# Patient Record
Sex: Male | Born: 1960 | Hispanic: No | Marital: Married | Smoking: Former smoker
Health system: Southern US, Community
[De-identification: ages and names within clinical notes are randomized; demographics above are authoritative.]

## PROBLEM LIST (undated history)

## (undated) DIAGNOSIS — K219 Gastro-esophageal reflux disease without esophagitis: Secondary | ICD-10-CM

## (undated) DIAGNOSIS — K5792 Diverticulitis of intestine, part unspecified, without perforation or abscess without bleeding: Secondary | ICD-10-CM

## (undated) DIAGNOSIS — T4145XA Adverse effect of unspecified anesthetic, initial encounter: Secondary | ICD-10-CM

## (undated) DIAGNOSIS — T8859XA Other complications of anesthesia, initial encounter: Secondary | ICD-10-CM

## (undated) DIAGNOSIS — E7800 Pure hypercholesterolemia, unspecified: Secondary | ICD-10-CM

## (undated) HISTORY — PX: TONSILLECTOMY: SUR1361

## (undated) HISTORY — PX: PILONIDAL CYST EXCISION: SHX744

---

## 2011-12-28 DIAGNOSIS — F524 Premature ejaculation: Secondary | ICD-10-CM | POA: Insufficient documentation

## 2013-01-15 ENCOUNTER — Other Ambulatory Visit: Payer: Self-pay | Admitting: Nurse Practitioner

## 2013-01-15 DIAGNOSIS — R0989 Other specified symptoms and signs involving the circulatory and respiratory systems: Secondary | ICD-10-CM

## 2013-01-21 ENCOUNTER — Ambulatory Visit
Admission: RE | Admit: 2013-01-21 | Discharge: 2013-01-21 | Disposition: A | Discharge status: Home / Self Care | Payer: BC Managed Care – PPO | Source: Ambulatory Visit | Attending: Nurse Practitioner | Admitting: Nurse Practitioner

## 2013-01-21 DIAGNOSIS — R0989 Other specified symptoms and signs involving the circulatory and respiratory systems: Secondary | ICD-10-CM

## 2013-04-04 DIAGNOSIS — Z720 Tobacco use: Secondary | ICD-10-CM | POA: Insufficient documentation

## 2013-04-04 DIAGNOSIS — E785 Hyperlipidemia, unspecified: Secondary | ICD-10-CM | POA: Insufficient documentation

## 2014-03-13 DIAGNOSIS — M6530 Trigger finger, unspecified finger: Secondary | ICD-10-CM | POA: Insufficient documentation

## 2014-11-17 DIAGNOSIS — R300 Dysuria: Secondary | ICD-10-CM | POA: Insufficient documentation

## 2014-11-17 DIAGNOSIS — N400 Enlarged prostate without lower urinary tract symptoms: Secondary | ICD-10-CM | POA: Insufficient documentation

## 2014-12-28 ENCOUNTER — Emergency Department (HOSPITAL_COMMUNITY): Payer: BLUE CROSS/BLUE SHIELD

## 2014-12-28 ENCOUNTER — Emergency Department (HOSPITAL_COMMUNITY)
Admission: EM | Admit: 2014-12-28 | Discharge: 2014-12-28 | Disposition: A | Discharge status: Home / Self Care | Payer: BLUE CROSS/BLUE SHIELD | Attending: Emergency Medicine | Admitting: Emergency Medicine

## 2014-12-28 ENCOUNTER — Encounter (HOSPITAL_COMMUNITY): Payer: Self-pay | Admitting: Nurse Practitioner

## 2014-12-28 DIAGNOSIS — R000 Tachycardia, unspecified: Secondary | ICD-10-CM | POA: Insufficient documentation

## 2014-12-28 DIAGNOSIS — M549 Dorsalgia, unspecified: Secondary | ICD-10-CM | POA: Diagnosis not present

## 2014-12-28 DIAGNOSIS — R509 Fever, unspecified: Secondary | ICD-10-CM | POA: Insufficient documentation

## 2014-12-28 DIAGNOSIS — R300 Dysuria: Secondary | ICD-10-CM | POA: Insufficient documentation

## 2014-12-28 DIAGNOSIS — Z720 Tobacco use: Secondary | ICD-10-CM | POA: Insufficient documentation

## 2014-12-28 DIAGNOSIS — M25562 Pain in left knee: Secondary | ICD-10-CM | POA: Insufficient documentation

## 2014-12-28 DIAGNOSIS — R110 Nausea: Secondary | ICD-10-CM | POA: Diagnosis not present

## 2014-12-28 DIAGNOSIS — R6889 Other general symptoms and signs: Secondary | ICD-10-CM

## 2014-12-28 DIAGNOSIS — R05 Cough: Secondary | ICD-10-CM | POA: Diagnosis not present

## 2014-12-28 LAB — COMPREHENSIVE METABOLIC PANEL
ALT: 39 U/L (ref 17–63)
AST: 41 U/L (ref 15–41)
Albumin: 3.7 g/dL (ref 3.5–5.0)
Alkaline Phosphatase: 102 U/L (ref 38–126)
Anion gap: 12 (ref 5–15)
BUN: 17 mg/dL (ref 6–20)
CHLORIDE: 103 mmol/L (ref 101–111)
CO2: 21 mmol/L — AB (ref 22–32)
Calcium: 9 mg/dL (ref 8.9–10.3)
Creatinine, Ser: 1.39 mg/dL — ABNORMAL HIGH (ref 0.61–1.24)
GFR, EST NON AFRICAN AMERICAN: 57 mL/min — AB (ref >60)
Glucose, Bld: 124 mg/dL — ABNORMAL HIGH (ref 65–99)
POTASSIUM: 4.5 mmol/L (ref 3.5–5.1)
SODIUM: 136 mmol/L (ref 135–145)
Total Bilirubin: 0.9 mg/dL (ref 0.3–1.2)
Total Protein: 6.3 g/dL — ABNORMAL LOW (ref 6.5–8.1)

## 2014-12-28 LAB — DIFFERENTIAL
BASOS ABS: 0.1 10*3/uL (ref 0.0–0.1)
Basophils Relative: 2 %
EOS ABS: 0 10*3/uL (ref 0.0–0.7)
Eosinophils Relative: 0 %
LYMPHS ABS: 0.4 10*3/uL — AB (ref 0.7–4.0)
Lymphocytes Relative: 6 %
Monocytes Absolute: 0.2 10*3/uL (ref 0.1–1.0)
Monocytes Relative: 3 %
NEUTROS ABS: 6.3 10*3/uL (ref 1.7–7.7)
NEUTROS PCT: 89 %

## 2014-12-28 LAB — URINALYSIS, ROUTINE W REFLEX MICROSCOPIC
Bilirubin Urine: NEGATIVE
Glucose, UA: NEGATIVE mg/dL
Hgb urine dipstick: NEGATIVE
Ketones, ur: 15 mg/dL — AB
LEUKOCYTES UA: NEGATIVE
NITRITE: NEGATIVE
PROTEIN: 30 mg/dL — AB
Specific Gravity, Urine: 1.027 (ref 1.005–1.030)
UROBILINOGEN UA: 0.2 mg/dL (ref 0.0–1.0)
pH: 5.5 (ref 5.0–8.0)

## 2014-12-28 LAB — I-STAT CG4 LACTIC ACID, ED: LACTIC ACID, VENOUS: 1.44 mmol/L (ref 0.5–2.0)

## 2014-12-28 LAB — CBC
HCT: 42.4 % (ref 39.0–52.0)
HEMOGLOBIN: 13.8 g/dL (ref 13.0–17.0)
MCH: 31.2 pg (ref 26.0–34.0)
MCHC: 32.5 g/dL (ref 30.0–36.0)
MCV: 95.7 fL (ref 78.0–100.0)
Platelets: 128 10*3/uL — ABNORMAL LOW (ref 150–400)
RBC: 4.43 MIL/uL (ref 4.22–5.81)
RDW: 13.9 % (ref 11.5–15.5)
WBC: 7.1 10*3/uL (ref 4.0–10.5)

## 2014-12-28 LAB — URINE MICROSCOPIC-ADD ON

## 2014-12-28 LAB — LIPASE, BLOOD: Lipase: 20 U/L — ABNORMAL LOW (ref 22–51)

## 2014-12-28 MED ORDER — SODIUM CHLORIDE 0.9 % IV BOLUS (SEPSIS)
1000.0000 mL | Freq: Once | INTRAVENOUS | Status: AC
Start: 2014-12-28 — End: 2014-12-28
  Administered 2014-12-28: 1000 mL via INTRAVENOUS

## 2014-12-28 MED ORDER — ACETAMINOPHEN 325 MG PO TABS
650.0000 mg | ORAL_TABLET | Freq: Once | ORAL | Status: AC | PRN
  Administered 2014-12-28: 650 mg via ORAL

## 2014-12-28 MED ORDER — ACETAMINOPHEN 325 MG PO TABS
ORAL_TABLET | ORAL | Status: AC
  Filled 2014-12-28: qty 2

## 2014-12-28 NOTE — ED Notes (Signed)
He c/o joint pain, fevers, chills, upset stomach and SOB onset after eating dinner last night. He has been taking antacids and ibuprofen for upset stomach and fevers at home. Fevers have persisted. He is A&Ox4, able to speak in full unlabored sentences

## 2014-12-28 NOTE — ED Notes (Signed)
Patient transported to X-ray 

## 2014-12-28 NOTE — ED Provider Notes (Signed)
CSN: [id number]     Arrival date & time 12/28/14  1650 History   First MD Initiated Contact with Patient 12/28/14 1706     Chief Complaint  Patient presents with  . Fever     (Consider location/radiation/quality/duration/timing/severity/associated sxs/prior Treatment) HPI  54 year old male presents with acute fever and chills since last night. Patient states he was feeling well, then had [employer] for dinner. About 1 hour later he started to have severe chills. He states he did not have a funny taste or bad taste in the dinner. Multiple other family murmurs had the same dinner with no current symptoms. He has not had any diarrhea. Patient felt nauseated once last night but no vomiting. He has had intermittent chills that feel much better after getting ibuprofen. The patient states that his temperature was up to 103 today. At one point he was shaking so hard he seemed to turn blue according to the wife. He has not any shortness of breath but has had some cough. He is also complaining of intermittent back aching as well as left knee pain. He has not noticing a knee swelling or redness. No headache or sore throat. Feels like his chest is congested. Patient states last night he had dysuria. He has been treated for chronic prostatitis in the past but is not on current antibiotics. After the initial dysuria he had multiple other urinations without pain until he gave [place] a sample today, states it was painful but no blood. Took ibuprofen prior to arrival. Patient has received his flu shot this year.  History reviewed. No pertinent past medical history. Past Surgical History  Procedure Laterality Date  . Tonsillectomy     History reviewed. No pertinent family history. Social History  Substance Use Topics  . Smoking status: Current Every Day Smoker    Types: Cigarettes  . Smokeless tobacco: None  . Alcohol Use: Yes    Review of Systems  Constitutional: Positive for fever and chills.  Respiratory:  Positive for cough. Negative for shortness of breath.   Cardiovascular: Negative for chest pain.  Gastrointestinal: Positive for nausea. Negative for vomiting, abdominal pain and diarrhea.  Genitourinary: Positive for dysuria. Negative for hematuria.  Musculoskeletal: Positive for back pain and arthralgias. Negative for joint swelling.  Neurological: Negative for headaches.  All other systems reviewed and are negative.     Allergies  Review of patient's allergies indicates no known allergies.  Home Medications   Prior to Admission medications   Not on File   BP 149/76 mmHg  Pulse 126  Temp(Src) 102.6 F (39.2 C) (Oral)  Resp 18  SpO2 98% Physical Exam  Constitutional: He is oriented to person, place, and time. He appears well-developed and well-nourished.  HENT:  Head: Normocephalic and atraumatic.  Right Ear: External ear normal.  Left Ear: External ear normal.  Nose: Nose normal.  Mouth/Throat: No oropharyngeal exudate.  Eyes: Right eye exhibits no discharge. Left eye exhibits no discharge.  Neck: Neck supple.  Cardiovascular: Regular rhythm, normal heart sounds and intact distal pulses.  Tachycardia present.   Pulmonary/Chest: Effort normal and breath sounds normal. He has no wheezes. He has no rales.  Abdominal: Soft. He exhibits no distension. There is no tenderness. There is no CVA tenderness.  Musculoskeletal: He exhibits no edema.  No focal extremity swelling or joint swelling. No erythema. Left knee is nontender, full ROM, no erythema or joint swelling  Neurological: He is alert and oriented to person, place, and time.  Skin: Skin  is warm and dry.  Nursing note and vitals reviewed.   ED Course  Procedures (including critical care time) Labs Review Labs Reviewed  COMPREHENSIVE METABOLIC PANEL - Abnormal; Notable for the following:    CO2 21 (*)    Glucose, Bld 124 (*)    Creatinine, Ser 1.39 (*)    Total Protein 6.3 (*)    GFR calc non Af Amer 57 (*)     All other components within normal limits  URINALYSIS, ROUTINE W REFLEX MICROSCOPIC (NOT AT [facility]) - Abnormal; Notable for the following:    Ketones, ur 15 (*)    Protein, ur 30 (*)    All other components within normal limits  CBC - Abnormal; Notable for the following:    Platelets 128 (*)    All other components within normal limits  DIFFERENTIAL - Abnormal; Notable for the following:    Lymphs Abs 0.4 (*)    All other components within normal limits  CULTURE, BLOOD (ROUTINE X 2)  CULTURE, BLOOD (ROUTINE X 2)  URINE CULTURE  URINE MICROSCOPIC-ADD ON  LIPASE, BLOOD  I-STAT CG4 LACTIC ACID, ED  I-STAT CG4 LACTIC ACID, ED    Imaging Review Dg Chest 2 View  12/28/2014   CLINICAL DATA:  Fever and shortness of breath for 2 days.  Smoker.  EXAM: CHEST  2 VIEW  COMPARISON:  None.  FINDINGS: Lower thoracic spondylosis. Midline trachea. Normal heart size and mediastinal contours. No pleural effusion or pneumothorax. Diffuse peribronchial thickening. No lobar consolidation.  IMPRESSION: 1.  No acute cardiopulmonary disease. 2. Mild peribronchial thickening which may relate to chronic bronchitis or smoking.   Electronically Signed   By: Jeronimo Greaves M.D.   On: 12/28/2014 18:45   I have personally reviewed and evaluated these images and lab results as part of my medical decision-making.   EKG Interpretation   Date/Time:  Sunday December 28 2014 16:59:45 EDT Ventricular Rate:  118 PR Interval:  114 QRS Duration: 66 QT Interval:  288 QTC Calculation: 403 R Axis:   -58 Text Interpretation:  Sinus tachycardia Left axis deviation Abnormal ECG  No old tracing to compare Confirmed by Jahniah Pallas  MD, Analiyah Lechuga ([id number]) on  12/28/2014 5:15:04 PM      MDM   Final diagnoses:  Flu-like symptoms    Patient symptoms are most consistent with a flulike illness. No signs of joint effusion or septic arthritis. No pneumonia on x-ray and no hypoxemia. Heart rate has improved with fever control and fluids. Lab  work is unremarkable and there is no UTI. No abdominal pain. Discussed possible treatments such as Tamiflu with patient. He has no history of lung disease, immunosuppression, or other high risk factors for flu. He does not want Tamiflu prescription I feel this is reasonable. Plan to discharge with fluid intake, Tylenol, and ibuprofen at home as needed. Creatinine with mild elevation of 1.39, unclear what his baseline is. Recommend fluids at home and follow-up with PCP.    Pricilla Loveless, MD 12/28/14 [phone]

## 2014-12-29 ENCOUNTER — Telehealth (HOSPITAL_COMMUNITY): Payer: Self-pay

## 2014-12-29 LAB — URINE CULTURE: Culture: NO GROWTH

## 2014-12-29 NOTE — Telephone Encounter (Signed)
decided that he does want tamiflu-spoke with PA camprubi-soms - pt now out of window to take medication. Pt informed

## 2015-01-02 LAB — CULTURE, BLOOD (ROUTINE X 2)
Culture: NO GROWTH
Culture: NO GROWTH

## 2015-01-07 ENCOUNTER — Emergency Department (HOSPITAL_COMMUNITY): Payer: BLUE CROSS/BLUE SHIELD

## 2015-01-07 ENCOUNTER — Inpatient Hospital Stay (HOSPITAL_COMMUNITY): Payer: BLUE CROSS/BLUE SHIELD

## 2015-01-07 ENCOUNTER — Encounter (HOSPITAL_COMMUNITY): Payer: Self-pay | Admitting: *Deleted

## 2015-01-07 ENCOUNTER — Inpatient Hospital Stay (HOSPITAL_COMMUNITY)
Admission: EM | Admit: 2015-01-07 | Discharge: 2015-01-12 | DRG: 871 | Disposition: A | Discharge status: Home Health | Payer: BLUE CROSS/BLUE SHIELD | Attending: Internal Medicine | Admitting: Internal Medicine

## 2015-01-07 DIAGNOSIS — K219 Gastro-esophageal reflux disease without esophagitis: Secondary | ICD-10-CM | POA: Diagnosis present

## 2015-01-07 DIAGNOSIS — Z79899 Other long term (current) drug therapy: Secondary | ICD-10-CM

## 2015-01-07 DIAGNOSIS — E7800 Pure hypercholesterolemia, unspecified: Secondary | ICD-10-CM | POA: Diagnosis present

## 2015-01-07 DIAGNOSIS — K7030 Alcoholic cirrhosis of liver without ascites: Secondary | ICD-10-CM | POA: Diagnosis present

## 2015-01-07 DIAGNOSIS — K5720 Diverticulitis of large intestine with perforation and abscess without bleeding: Secondary | ICD-10-CM | POA: Diagnosis present

## 2015-01-07 DIAGNOSIS — E785 Hyperlipidemia, unspecified: Secondary | ICD-10-CM | POA: Diagnosis present

## 2015-01-07 DIAGNOSIS — F1010 Alcohol abuse, uncomplicated: Secondary | ICD-10-CM | POA: Diagnosis present

## 2015-01-07 DIAGNOSIS — K750 Abscess of liver: Secondary | ICD-10-CM | POA: Diagnosis present

## 2015-01-07 DIAGNOSIS — N419 Inflammatory disease of prostate, unspecified: Secondary | ICD-10-CM | POA: Diagnosis present

## 2015-01-07 DIAGNOSIS — F17210 Nicotine dependence, cigarettes, uncomplicated: Secondary | ICD-10-CM | POA: Diagnosis present

## 2015-01-07 DIAGNOSIS — N321 Vesicointestinal fistula: Secondary | ICD-10-CM | POA: Diagnosis present

## 2015-01-07 DIAGNOSIS — Z7982 Long term (current) use of aspirin: Secondary | ICD-10-CM

## 2015-01-07 DIAGNOSIS — K5780 Diverticulitis of intestine, part unspecified, with perforation and abscess without bleeding: Secondary | ICD-10-CM | POA: Diagnosis not present

## 2015-01-07 DIAGNOSIS — A419 Sepsis, unspecified organism: Principal | ICD-10-CM | POA: Diagnosis present

## 2015-01-07 DIAGNOSIS — K7010 Alcoholic hepatitis without ascites: Secondary | ICD-10-CM | POA: Diagnosis present

## 2015-01-07 DIAGNOSIS — B954 Other streptococcus as the cause of diseases classified elsewhere: Secondary | ICD-10-CM | POA: Diagnosis not present

## 2015-01-07 DIAGNOSIS — K5792 Diverticulitis of intestine, part unspecified, without perforation or abscess without bleeding: Secondary | ICD-10-CM | POA: Diagnosis present

## 2015-01-07 HISTORY — DX: Pure hypercholesterolemia, unspecified: E78.00

## 2015-01-07 LAB — URINE MICROSCOPIC-ADD ON

## 2015-01-07 LAB — COMPREHENSIVE METABOLIC PANEL
ALK PHOS: 153 U/L — AB (ref 38–126)
ALK PHOS: 136 U/L — AB (ref 38–126)
ALT: 83 U/L — AB (ref 17–63)
ALT: 77 U/L — AB (ref 17–63)
AST: 56 U/L — ABNORMAL HIGH (ref 15–41)
AST: 51 U/L — AB (ref 15–41)
Albumin: 2.8 g/dL — ABNORMAL LOW (ref 3.5–5.0)
Albumin: 2.6 g/dL — ABNORMAL LOW (ref 3.5–5.0)
Anion gap: 10 (ref 5–15)
Anion gap: 11 (ref 5–15)
BUN: 13 mg/dL (ref 6–20)
BUN: 11 mg/dL (ref 6–20)
CALCIUM: 8.5 mg/dL — AB (ref 8.9–10.3)
CHLORIDE: 98 mmol/L — AB (ref 101–111)
CO2: 24 mmol/L (ref 22–32)
CO2: 24 mmol/L (ref 22–32)
CREATININE: 1.05 mg/dL (ref 0.61–1.24)
CREATININE: 1.08 mg/dL (ref 0.61–1.24)
Calcium: 8.8 mg/dL — ABNORMAL LOW (ref 8.9–10.3)
Chloride: 98 mmol/L — ABNORMAL LOW (ref 101–111)
Glucose, Bld: 121 mg/dL — ABNORMAL HIGH (ref 65–99)
Glucose, Bld: 125 mg/dL — ABNORMAL HIGH (ref 65–99)
POTASSIUM: 4 mmol/L (ref 3.5–5.1)
Potassium: 4.3 mmol/L (ref 3.5–5.1)
Sodium: 132 mmol/L — ABNORMAL LOW (ref 135–145)
Sodium: 133 mmol/L — ABNORMAL LOW (ref 135–145)
Total Bilirubin: 0.7 mg/dL (ref 0.3–1.2)
Total Bilirubin: 0.9 mg/dL (ref 0.3–1.2)
Total Protein: 6.5 g/dL (ref 6.5–8.1)
Total Protein: 6 g/dL — ABNORMAL LOW (ref 6.5–8.1)

## 2015-01-07 LAB — CBC WITH DIFFERENTIAL/PLATELET
Basophils Absolute: 0 10*3/uL (ref 0.0–0.1)
Basophils Absolute: 0 10*3/uL (ref 0.0–0.1)
Basophils Relative: 0 %
Basophils Relative: 0 %
Eosinophils Absolute: 0 10*3/uL (ref 0.0–0.7)
Eosinophils Absolute: 0 10*3/uL (ref 0.0–0.7)
Eosinophils Relative: 0 %
Eosinophils Relative: 0 %
HCT: 32.6 % — ABNORMAL LOW (ref 39.0–52.0)
HEMATOCRIT: 34.2 % — AB (ref 39.0–52.0)
HEMOGLOBIN: 11.5 g/dL — AB (ref 13.0–17.0)
HEMOGLOBIN: 10.8 g/dL — AB (ref 13.0–17.0)
LYMPHS ABS: 1.4 10*3/uL (ref 0.7–4.0)
LYMPHS ABS: 1.7 10*3/uL (ref 0.7–4.0)
LYMPHS PCT: 8 %
LYMPHS PCT: 11 %
MCH: 30.9 pg (ref 26.0–34.0)
MCH: 30.7 pg (ref 26.0–34.0)
MCHC: 33.6 g/dL (ref 30.0–36.0)
MCHC: 33.1 g/dL (ref 30.0–36.0)
MCV: 91.9 fL (ref 78.0–100.0)
MCV: 92.6 fL (ref 78.0–100.0)
Monocytes Absolute: 1.6 10*3/uL — ABNORMAL HIGH (ref 0.1–1.0)
Monocytes Absolute: 1.5 10*3/uL — ABNORMAL HIGH (ref 0.1–1.0)
Monocytes Relative: 10 %
Monocytes Relative: 9 %
NEUTROS ABS: 13.8 10*3/uL — AB (ref 1.7–7.7)
NEUTROS ABS: 12.6 10*3/uL — AB (ref 1.7–7.7)
NEUTROS PCT: 82 %
NEUTROS PCT: 80 %
Platelets: 281 10*3/uL (ref 150–400)
Platelets: 264 10*3/uL (ref 150–400)
RBC: 3.72 MIL/uL — AB (ref 4.22–5.81)
RBC: 3.52 MIL/uL — AB (ref 4.22–5.81)
RDW: 13.8 % (ref 11.5–15.5)
RDW: 14.1 % (ref 11.5–15.5)
WBC: 16.8 10*3/uL — AB (ref 4.0–10.5)
WBC: 15.8 10*3/uL — AB (ref 4.0–10.5)

## 2015-01-07 LAB — URINALYSIS, ROUTINE W REFLEX MICROSCOPIC
GLUCOSE, UA: NEGATIVE mg/dL
Hgb urine dipstick: NEGATIVE
KETONES UR: 15 mg/dL — AB
LEUKOCYTES UA: NEGATIVE
NITRITE: NEGATIVE
PROTEIN: 30 mg/dL — AB
Specific Gravity, Urine: 1.028 (ref 1.005–1.030)
Urobilinogen, UA: 1 mg/dL (ref 0.0–1.0)
pH: 5 (ref 5.0–8.0)

## 2015-01-07 LAB — PROTIME-INR
INR: 1.16 (ref 0.00–1.49)
Prothrombin Time: 15 seconds (ref 11.6–15.2)

## 2015-01-07 LAB — APTT: aPTT: 39 seconds — ABNORMAL HIGH (ref 24–37)

## 2015-01-07 LAB — LACTIC ACID, PLASMA
LACTIC ACID, VENOUS: 1.4 mmol/L (ref 0.5–2.0)
LACTIC ACID, VENOUS: 0.8 mmol/L (ref 0.5–2.0)

## 2015-01-07 LAB — PHOSPHORUS: Phosphorus: 3.4 mg/dL (ref 2.5–4.6)

## 2015-01-07 LAB — LIPASE, BLOOD: LIPASE: 20 U/L — AB (ref 22–51)

## 2015-01-07 LAB — MAGNESIUM: MAGNESIUM: 1.9 mg/dL (ref 1.7–2.4)

## 2015-01-07 LAB — PROCALCITONIN: Procalcitonin: 0.38 ng/mL

## 2015-01-07 MED ORDER — ONDANSETRON HCL 4 MG/2ML IJ SOLN: 4.0000 mg | Freq: Three times a day (TID) | INTRAMUSCULAR | Status: DC | PRN

## 2015-01-07 MED ORDER — SODIUM CHLORIDE 0.9 % IJ SOLN
3.0000 mL | Freq: Two times a day (BID) | INTRAMUSCULAR | Status: DC
  Administered 2015-01-08: 3 mL via INTRAVENOUS

## 2015-01-07 MED ORDER — ACETAMINOPHEN 325 MG PO TABS
650.0000 mg | ORAL_TABLET | Freq: Four times a day (QID) | ORAL | Status: DC | PRN
  Administered 2015-01-08 (×2): 650 mg via ORAL
  Filled 2015-01-07 (×3): qty 2

## 2015-01-07 MED ORDER — MORPHINE SULFATE (PF) 2 MG/ML IV SOLN
2.0000 mg | INTRAVENOUS | Status: DC | PRN
  Administered 2015-01-08 – 2015-01-10 (×4): 2 mg via INTRAVENOUS
  Filled 2015-01-07 (×4): qty 1

## 2015-01-07 MED ORDER — PAROXETINE HCL 20 MG PO TABS
20.0000 mg | ORAL_TABLET | Freq: Every day | ORAL | Status: DC
  Administered 2015-01-08 – 2015-01-12 (×5): 20 mg via ORAL
  Filled 2015-01-07 (×5): qty 1

## 2015-01-07 MED ORDER — TAMSULOSIN HCL 0.4 MG PO CAPS
0.4000 mg | ORAL_CAPSULE | Freq: Every day | ORAL | Status: DC
  Administered 2015-01-07 – 2015-01-11 (×5): 0.4 mg via ORAL
  Filled 2015-01-07 (×5): qty 1

## 2015-01-07 MED ORDER — OXYCODONE HCL 5 MG PO TABS
5.0000 mg | ORAL_TABLET | ORAL | Status: DC | PRN
  Administered 2015-01-09 (×3): 5 mg via ORAL
  Filled 2015-01-07 (×3): qty 1

## 2015-01-07 MED ORDER — PIPERACILLIN-TAZOBACTAM 3.375 G IVPB
3.3750 g | Freq: Three times a day (TID) | INTRAVENOUS | Status: DC
  Administered 2015-01-07 – 2015-01-12 (×14): 3.375 g via INTRAVENOUS
  Filled 2015-01-07 (×15): qty 50

## 2015-01-07 MED ORDER — ATORVASTATIN CALCIUM 20 MG PO TABS
20.0000 mg | ORAL_TABLET | Freq: Every day | ORAL | Status: DC
  Administered 2015-01-07 – 2015-01-11 (×5): 20 mg via ORAL
  Filled 2015-01-07 (×5): qty 1

## 2015-01-07 MED ORDER — VANCOMYCIN HCL IN DEXTROSE 1-5 GM/200ML-% IV SOLN
1000.0000 mg | Freq: Three times a day (TID) | INTRAVENOUS | Status: DC
  Administered 2015-01-08 – 2015-01-12 (×13): 1000 mg via INTRAVENOUS
  Filled 2015-01-07 (×16): qty 200

## 2015-01-07 MED ORDER — SODIUM CHLORIDE 0.9 % IV SOLN
2000.0000 mg | Freq: Once | INTRAVENOUS | Status: AC
  Administered 2015-01-07: 2000 mg via INTRAVENOUS
  Filled 2015-01-07: qty 2000

## 2015-01-07 MED ORDER — PIPERACILLIN-TAZOBACTAM 3.375 G IVPB 30 MIN: 3.3750 g | Freq: Once | INTRAVENOUS | Status: DC

## 2015-01-07 MED ORDER — ACETAMINOPHEN 650 MG RE SUPP: 650.0000 mg | Freq: Four times a day (QID) | RECTAL | Status: DC | PRN

## 2015-01-07 MED ORDER — IBUPROFEN 800 MG PO TABS
800.0000 mg | ORAL_TABLET | Freq: Once | ORAL | Status: AC
  Administered 2015-01-07: 800 mg via ORAL
  Filled 2015-01-07: qty 1

## 2015-01-07 MED ORDER — PANTOPRAZOLE SODIUM 40 MG PO TBEC
40.0000 mg | DELAYED_RELEASE_TABLET | Freq: Every day | ORAL | Status: DC
  Administered 2015-01-08 – 2015-01-12 (×5): 40 mg via ORAL
  Filled 2015-01-07 (×5): qty 1

## 2015-01-07 MED ORDER — ONDANSETRON HCL 4 MG/2ML IJ SOLN: 4.0000 mg | Freq: Four times a day (QID) | INTRAMUSCULAR | Status: DC | PRN

## 2015-01-07 MED ORDER — SODIUM CHLORIDE 0.9 % IV BOLUS (SEPSIS)
1000.0000 mL | Freq: Once | INTRAVENOUS | Status: AC
  Administered 2015-01-07: 1000 mL via INTRAVENOUS

## 2015-01-07 MED ORDER — PIPERACILLIN-TAZOBACTAM 3.375 G IVPB
3.3750 g | Freq: Once | INTRAVENOUS | Status: AC
  Administered 2015-01-07: 3.375 g via INTRAVENOUS
  Filled 2015-01-07: qty 50

## 2015-01-07 MED ORDER — IOHEXOL 300 MG/ML  SOLN
100.0000 mL | Freq: Once | INTRAMUSCULAR | Status: AC | PRN
  Administered 2015-01-07: 100 mL via INTRAVENOUS

## 2015-01-07 MED ORDER — METRONIDAZOLE IN NACL 5-0.79 MG/ML-% IV SOLN
500.0000 mg | Freq: Three times a day (TID) | INTRAVENOUS | Status: DC
  Administered 2015-01-07 – 2015-01-10 (×8): 500 mg via INTRAVENOUS
  Filled 2015-01-07 (×10): qty 100

## 2015-01-07 MED ORDER — DEXTROSE-NACL 5-0.9 % IV SOLN
INTRAVENOUS | Status: DC
  Administered 2015-01-07 – 2015-01-09 (×4): via INTRAVENOUS

## 2015-01-07 MED ORDER — ONDANSETRON HCL 4 MG PO TABS: 4.0000 mg | ORAL_TABLET | Freq: Four times a day (QID) | ORAL | Status: DC | PRN

## 2015-01-07 NOTE — ED Notes (Signed)
Pt given [place].  Barnetta ChapelKelly Osborne PA with surgery stated okay to eat/drink.  Pt only wants clear liquids at present.

## 2015-01-07 NOTE — Consult Note (Signed)
Bobby Roth 1961-01-18  [id number].   Primary Care MD: Dr. Veronia Beets Requesting MD: Dr. Davonna Belling Chief Complaint/Reason for Consult: possible colovesical fistula and liver abscess HPI: This is a very pleasant 54 yo white male who has had fevers of unknown origins for the last 2 weeks.  He has complained of some right flank pain within the last couple of days, but otherwise no major complaints except fevers.  He states back in May he was having dysuria and was sent to a urologist.  He was felt to have prostatitis.  He was placed on 30 days of doxycycline.  This did improve.  He denies any CP, SOB, upper respiratory symptoms etc.  He saw his PCP on Monday for continued fevers after the ED treated him for the flu, which his Flu test was negative.  He was given a shot of Rocephin in the office and then sent home on another dose of Cipro.  He had a CXR and UA which were negative.  He saw his PCP again today and due to persistent fevers, he was referred to the ED for a CT scan of the Abd/pelvis.  This revealed a possible colovesical fistula and a 7x7x5cm liver abscess.  His UA was still clean here and he denies pneumaturia or feculent discharge in his urine.  We have been asked to see him for further evaluation.  ROS: Please see HPI, otherwise negative, except he does have a hx of tics 6-8 years ago.  He has had a colonoscopy for which we are trying to get a report of that.  History reviewed. No pertinent family history.  Past Medical History  Diagnosis Date  . Hypercholesterolemia     Past Surgical History  Procedure Laterality Date  . Tonsillectomy    . Pilonidal cyst excision      Social History:  reports that he has been smoking Cigarettes.  He has been smoking about 1.00 pack per day. He does not have any smokeless tobacco history on file. He reports that he drinks about 33.6 oz of alcohol per week. He reports that he does not use illicit drugs.  Allergies: No Known  Allergies   (Not in a hospital admission)  Blood pressure 115/69, pulse 77, temperature 101.2 F (38.4 C), temperature source Oral, resp. rate 20, SpO2 96 %. Physical Exam: General: pleasant, WD, WN white male who is laying in bed in NAD HEENT: head is normocephalic, atraumatic.  Sclera are noninjected.  PERRL.  Ears and nose without any masses or lesions.  Mouth is pink and moist Heart: regular, rate, and rhythm.  Normal s1,s2. No obvious murmurs, gallops, or rubs noted.  Palpable radial and pedal pulses bilaterally Lungs: CTAB, no wheezes, rhonchi, or rales noted.  Respiratory effort nonlabored Abd: soft, minimally RLQ tenderness and right flank tenderness, ND, +BS, no masses, hernias, or organomegaly MS: all 4 extremities are symmetrical with no cyanosis, clubbing, or edema. Skin: warm and dry with no masses, lesions, or rashes Psych: A&Ox3 with an appropriate affect.    Results for orders placed or performed during the hospital encounter of 01/07/15 (from the past 48 hour(s))  CBC with Differential     Status: Abnormal   Collection Time: 01/07/15 12:00 PM  Result Value Ref Range   WBC 16.8 (H) 4.0 - 10.5 K/uL   RBC 3.72 (L) 4.22 - 5.81 MIL/uL   Hemoglobin 11.5 (L) 13.0 - 17.0 g/dL   HCT 34.2 (L) 39.0 - 52.0 %   MCV  91.9 78.0 - 100.0 fL   MCH 30.9 26.0 - 34.0 pg   MCHC 33.6 30.0 - 36.0 g/dL   RDW 13.8 11.5 - 15.5 %   Platelets 281 150 - 400 K/uL   Neutrophils Relative % 82 %   Neutro Abs 13.8 (H) 1.7 - 7.7 K/uL   Lymphocytes Relative 8 %   Lymphs Abs 1.4 0.7 - 4.0 K/uL   Monocytes Relative 10 %   Monocytes Absolute 1.6 (H) 0.1 - 1.0 K/uL   Eosinophils Relative 0 %   Eosinophils Absolute 0.0 0.0 - 0.7 K/uL   Basophils Relative 0 %   Basophils Absolute 0.0 0.0 - 0.1 K/uL  Comprehensive metabolic panel     Status: Abnormal   Collection Time: 01/07/15 12:00 PM  Result Value Ref Range   Sodium 132 (L) 135 - 145 mmol/L   Potassium 4.0 3.5 - 5.1 mmol/L   Chloride 98 (L) 101  - 111 mmol/L   CO2 24 22 - 32 mmol/L   Glucose, Bld 121 (H) 65 - 99 mg/dL   BUN 13 6 - 20 mg/dL   Creatinine, Ser 1.05 0.61 - 1.24 mg/dL   Calcium 8.8 (L) 8.9 - 10.3 mg/dL   Total Protein 6.5 6.5 - 8.1 g/dL   Albumin 2.8 (L) 3.5 - 5.0 g/dL   AST 56 (H) 15 - 41 U/L   ALT 83 (H) 17 - 63 U/L   Alkaline Phosphatase 153 (H) 38 - 126 U/L   Total Bilirubin 0.7 0.3 - 1.2 mg/dL   GFR calc non Af Amer >60 >60 mL/min   GFR calc Af Amer >60 >60 mL/min    Comment: (NOTE) The eGFR has been calculated using the CKD EPI equation. This calculation has not been validated in all clinical situations. eGFR's persistently <60 mL/min signify possible Chronic Kidney Disease.    Anion gap 10 5 - 15  Lipase, blood     Status: Abnormal   Collection Time: 01/07/15 12:00 PM  Result Value Ref Range   Lipase 20 (L) 22 - 51 U/L  Urinalysis, Routine w reflex microscopic (not at [facility])     Status: Abnormal   Collection Time: 01/07/15  1:06 PM  Result Value Ref Range   Color, Urine AMBER (A) YELLOW    Comment: BIOCHEMICALS MAY BE AFFECTED BY COLOR   APPearance CLEAR CLEAR   Specific Gravity, Urine 1.028 1.005 - 1.030   pH 5.0 5.0 - 8.0   Glucose, UA NEGATIVE NEGATIVE mg/dL   Hgb urine dipstick NEGATIVE NEGATIVE   Bilirubin Urine SMALL (A) NEGATIVE   Ketones, ur 15 (A) NEGATIVE mg/dL   Protein, ur 30 (A) NEGATIVE mg/dL   Urobilinogen, UA 1.0 0.0 - 1.0 mg/dL   Nitrite NEGATIVE NEGATIVE   Leukocytes, UA NEGATIVE NEGATIVE  Urine microscopic-add on     Status: Abnormal   Collection Time: 01/07/15  1:06 PM  Result Value Ref Range   Squamous Epithelial / LPF RARE RARE   WBC, UA 0-2 <3 WBC/hpf   RBC / HPF 0-2 <3 RBC/hpf   Bacteria, UA FEW (A) RARE   Urine-Other MUCOUS PRESENT    Ct Abdomen Pelvis W Contrast  01/07/2015  CLINICAL DATA:  Fever of unknown origin. EXAM: CT ABDOMEN AND PELVIS WITH CONTRAST TECHNIQUE: Multidetector CT imaging of the abdomen and pelvis was performed using the standard protocol  following bolus administration of intravenous contrast. CONTRAST:  158m OMNIPAQUE IOHEXOL 300 MG/ML  SOLN COMPARISON:  None. FINDINGS: Lower chest:  Normal.  Hepatobiliary: Complex multi septated cystic lesion in the posterior medial aspect of the right lobe of the liver measuring 7.7 x 7.6 x 5.1 cm, most likely a hepatic abscess. Pancreas: Normal. Spleen: Normal. Adrenals/Urinary Tract: Normal adrenal glands. Normal kidneys and ureters. 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 There is a small air-containing 12 mm abscess adjacent to the abscess in the bladder wall. The fistula to the sigmoid portion of the colon is best seen on image 54 of series 6 and image 40 of series 5. This is immediately posterior to the anterior abdominal wall. Stomach/Bowel: Numerous diverticula in the distal colon with a focal fistula to the wall of the dome of the bladder. Terminal ileum and appendix are normal. Vascular/Lymphatic: No significant adenopathy. Aortic and iliac atherosclerosis. Reproductive: Normal. Other: No free air or free fluid. Musculoskeletal: No acute osseous abnormality. Large benign bone island in the head of the left femur with a smaller bone island in the left greater trochanter. Old Schmorl's node in the superior endplate of L2. IMPRESSION: 1. Complex cystic mass in the posterior medial aspect of the right lobe of the liver, most likely a liver abscess. 2. 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 Tiny adjacent 12 mm abscess containing air. Extensive adjacent sigmoid diverticulosis. Electronically Signed   By: Lorriane Shire M.D.   On: 01/07/2015 15:00       Assessment/Plan 1. Liver abscess, likely secondary to his colon -medical admission and recommend consultation by IR for perc drainage  of this abscess -recommend IV abx therapy 2. Possible colovesical fistula, h/o diverticulitis -patient had diverticulitis 6-8 years ago and suspect the finding of possible fistula is secondary to this. -he is not having any symptoms such as a dirty UA, pneumaturia, or voiding stool.  He will still likely require a sigmoid colectomy at some point in the future, but this would not be done until the liver infection has completed resolved.  -no acute surgical indications at this time.  We will follow along with you.  The patient will need IR consult and NPO p MN for probably procedure tomorrow.  Damyia Strider E 01/07/2015, 4:15 PM Pager: [phone]

## 2015-01-07 NOTE — ED Notes (Signed)
Informed PA Idol that patients bp had dropped to 95/47 and hung a liter NS per order

## 2015-01-07 NOTE — ED Notes (Signed)
Attempted to call report and nurse to call back

## 2015-01-07 NOTE — ED Notes (Addendum)
Pt is here with recurring fevers for 2 weeks with the highest of 104.9 at home.  Pt is having flank to mid back pain.  He had been started on anbx for bacterial infection and unable to find source.  Pt has been dealing with prostate issues as well.  No cough. Pt reports pain to right mid to lower back area and has presently stopped.  Pt states felt sob with taking deep breaths

## 2015-01-07 NOTE — H&P (Signed)
[facility] History and Physical  velma hanna WJX:[id number] DOB: 09-Sep-1960 DOA: 01/07/2015  Referring physician: Dr. Rubin Payor PCP: Elizabeth Palau, FNP   Chief Complaint: pain with urination  HPI: Bobby Roth is a 54 y.o. male  Presenting with fever of unknown origin. The patient states that he has had dysuria since May. Since then the head is been treated with antibiotics for dysuria but patient reports that since that time he has continued to have dysuria. Patient had multiple visits to the ED secondary to fevers. Workup did not show source as such patient's primary care physician recommended ED evaluation for a more thorough evaluation which was to include CT scan of abdomen and pelvis.  CT scan results reports intramural abscess in the wall of the dome of the bladder with a fistula to the sigmoid colon. There is also most likely a liver abscess. Gen. surgery contacted and recommended admission and into medical surface assay did not currently see any reason to do emergent operation upon initial evaluation.   Review of Systems:  Constitutional:  No weight loss, night sweats, + Fevers, chills, fatigue.  HEENT:  No headaches, Difficulty swallowing,Tooth/dental problems,Sore throat,  No sneezing, itching, ear ache, nasal congestion, post nasal drip,  Cardio-vascular:  No chest pain, Orthopnea, PND, swelling in lower extremities, anasarca, dizziness, palpitations  GI:  No heartburn, indigestion, + abdominal pain, nausea, vomiting, diarrhea, change in bowel habits, loss of appetite  Resp:  No shortness of breath with exertion or at rest. No excess mucus, no productive cough, No non-productive cough, No coughing up of blood.No change in color of mucus.No wheezing.No chest wall deformity  Skin:  no rash or lesions.  GU:  no dysuria, change in color of urine, no urgency or frequency. No flank pain.  Musculoskeletal:  No joint pain or swelling. No decreased range of  motion. No back pain.  Psych:  No change in mood or affect. No depression or anxiety. No memory loss.   Past Medical History  Diagnosis Date  . Hypercholesterolemia    Past Surgical History  Procedure Laterality Date  . Tonsillectomy    . Pilonidal cyst excision     Social History:  reports that he has been smoking Cigarettes.  He has been smoking about 1.00 pack per day. He does not have any smokeless tobacco history on file. He reports that he drinks about 33.6 oz of alcohol per week. He reports that he does not use illicit drugs.  No Known Allergies  Family history: None reported. Denies any history of Crohn's or ulcerative colitis  Prior to Admission medications   Medication Sig Start Date End Date Taking? Authorizing Provider  acetaminophen (TYLENOL) 500 MG tablet Take 500 mg by mouth every 6 (six) hours as needed for fever.   Yes Historical Provider, MD  aspirin EC 81 MG tablet Take 81 mg by mouth daily.   Yes Historical Provider, MD  atorvastatin (LIPITOR) 20 MG tablet Take 20 mg by mouth at bedtime. 12/22/14  Yes Historical Provider, MD  Cholecalciferol (VITAMIN D) 2000 UNITS tablet Take 2,000 Units by mouth daily.   Yes Historical Provider, MD  ciprofloxacin (CIPRO) 500 MG tablet Take 500 mg by mouth 2 (two) times daily. 01/05/15 01/15/15 Yes Historical Provider, MD  ibuprofen (ADVIL,MOTRIN) 200 MG tablet Take 400-600 mg by mouth 2 (two) times daily as needed for fever (pain).   Yes Historical Provider, MD  omeprazole (PRILOSEC) 20 MG capsule Take 20 mg by mouth daily. 10/28/14  Yes Historical  Provider, MD  PARoxetine (PAXIL) 20 MG tablet Take 20 mg by mouth daily. 12/18/14  Yes Historical Provider, MD  tamsulosin (FLOMAX) 0.4 MG CAPS capsule Take 0.4 mg by mouth at bedtime. 12/16/14  Yes Historical Provider, MD   Physical Exam: Filed Vitals:   01/07/15 1608 01/07/15 1615 01/07/15 1634 01/07/15 1714  BP: 115/69 108/68 100/49 109/46  Pulse: 77 80  90  Temp: 101.2 F (38.4 C)    98.7 F (37.1 C)  TempSrc: Oral   Oral  Resp: Height:     (1.727 m)  Weight:    82.101 kg (181 lb)  SpO2: 96% 96%  97%    Wt Readings from Last 3 Encounters:  01/07/15 82.101 kg (181 lb)    General:  Appears calm and comfortable Eyes: PERRL, normal lids, irises & conjunctiva ENT: grossly normal hearing, lips & tongue Neck: no LAD, masses or thyromegaly Cardiovascular: RRR, no m/r/g. No LE edema. Telemetry: SR, no arrhythmias  Respiratory: CTA bilaterally, no w/r/r. Normal respiratory effort. Abdomen: soft, mild tenderness with deep palpation over right lower quadrant, and no rebound tenderness, nd, negative Murphy's Skin: no rash or induration seen on limited exam Musculoskeletal: grossly normal tone BUE/BLE Psychiatric: grossly normal mood and affect, speech fluent and appropriate Neurologic: grossly non-focal.          Labs on Admission:  Basic Metabolic Panel:  Recent Labs Lab 01/07/15 1200  NA 132*  K 4.0  CL 98*  CO2 24  GLUCOSE 121*  BUN 13  CREATININE 1.05  CALCIUM 8.8*   Liver Function Tests:  Recent Labs Lab 01/07/15 1200  AST 56*  ALT 83*  ALKPHOS 153*  BILITOT 0.7  PROT 6.5  ALBUMIN 2.8*    Recent Labs Lab 01/07/15 1200  LIPASE 20*   No results for input(s): AMMONIA in the last 168 hours. CBC:  Recent Labs Lab 01/07/15 1200  WBC 16.8*  NEUTROABS 13.8*  HGB 11.5*  HCT 34.2*  MCV 91.9  PLT 281   Cardiac Enzymes: No results for input(s): CKTOTAL, CKMB, CKMBINDEX, TROPONINI in the last 168 hours.  BNP (last 3 results) No results for input(s): BNP in the last 8760 hours.  ProBNP (last 3 results) No results for input(s): PROBNP in the last 8760 hours.  CBG: No results for input(s): GLUCAP in the last 168 hours.  Radiological Exams on Admission: Ct Abdomen Pelvis W Contrast  01/07/2015  CLINICAL DATA:  Fever of unknown origin. EXAM: CT ABDOMEN AND PELVIS WITH CONTRAST TECHNIQUE: Multidetector CT imaging of  the abdomen and pelvis was performed using the standard protocol following bolus administration of intravenous contrast. CONTRAST:  OMNIPAQUE IOHEXOL 300 MG/ML  SOLN COMPARISON:  None. FINDINGS: Lower chest:  Normal. Hepatobiliary: Complex multi septated cystic lesion in the posterior medial aspect of the right lobe of the liver measuring 7.7 x 7.6 x 5.1 cm, most likely a hepatic abscess. Pancreas: Normal. Spleen: Normal. Adrenals/Urinary Tract: Normal adrenal glands. Normal kidneys and ureters. 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 There is a small air-containing 12 mm abscess adjacent to the abscess in the bladder wall. The fistula to the sigmoid portion of the colon is best seen on image 54 of series 6 and image 40 of series 5. This is immediately posterior  to the anterior abdominal wall. Stomach/Bowel: Numerous diverticula in the distal colon with a focal fistula to the wall of the dome of the bladder. Terminal ileum and appendix are normal. Vascular/Lymphatic: No significant adenopathy. Aortic and iliac atherosclerosis. Reproductive: Normal. Other: No free air or free fluid. Musculoskeletal: No acute osseous abnormality. Large benign bone island in the head of the left femur with a smaller bone island in the left greater trochanter. Old Schmorl's node in the superior endplate of L2. IMPRESSION: 1. Complex cystic mass in the posterior medial aspect of the right lobe of the liver, most likely a liver abscess. 2. 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 Tiny adjacent 12 mm abscess containing air. Extensive adjacent sigmoid diverticulosis. Electronically Signed   By: Francene BoyersJames  Maxwell M.D.   On: 01/07/2015 15:00    EKG: Independently reviewed. Normal sinus rhythm with no st elevations or  depressions  Assessment/Plan Active Problems:   Sepsis -Secondary to multiple intra-abdominal abscesses - Continue supportive therapy and monitoring within stepdown unit  Colonic diverticular abscess -Cover with Zosyn and Flagyl - Admit to telemetry    Liver abscess -Consult IR for assistance with drainage will make patient nothing by mouth after midnight - Cover with Zosyn and Flagyl given intra-abdominal source   Code Status: Full DVT Prophylaxis: Heparin Family Communication: Discussed with patient and family in room Disposition Plan: Pending further recommendations by specialist involved. For now patient meets sepsis criteria based on the elevated white blood cell count with fever and recorded  tachypnea. As such will admit to stepdown unit  Time spent: > 40 minutes  Bobby Roth, Bobby Roth [facility] Pager [phone]

## 2015-01-07 NOTE — ED Notes (Signed)
PA to bedside

## 2015-01-07 NOTE — Progress Notes (Signed)
ANTIBIOTIC CONSULT NOTE - INITIAL  Pharmacy Consult for vancomycin and Zosyn Indication: intra-abdominal infection / sepsis  No Known Allergies  Patient Measurements: Height: 5\' 8"  (172.7 cm) (patient reported) Weight: 181 lb (82.101 kg) (Pt reported) IBW/kg (Calculated) : 68.4   Vital Signs: Temp: 98.7 F (37.1 C) (10/12 1714) Temp Source: Oral (10/12 1714) BP: 109/46 mmHg (10/12 1714) Pulse Rate: 90 (10/12 1714) Intake/Output from previous day:   Intake/Output from this shift: Total I/O In: 2050 [I.V.:2050] Out: -   Labs:  Recent Labs  01/07/15 1200  WBC 16.8*  HGB 11.5*  PLT 281  CREATININE 1.05   Estimated Creatinine Clearance: 86 mL/min (by C-G formula based on Cr of 1.05). No results for input(s): VANCOTROUGH, VANCOPEAK, VANCORANDOM, GENTTROUGH, GENTPEAK, GENTRANDOM, TOBRATROUGH, TOBRAPEAK, TOBRARND, AMIKACINPEAK, AMIKACINTROU, AMIKACIN in the last 72 hours.   Microbiology: Recent Results (from the past 720 hour(s))  Urine culture     Status: None   Collection Time: 12/28/14  5:13 PM  Result Value Ref Range Status   Specimen Description URINE, CLEAN CATCH  Final   Special Requests NONE  Final   Culture NO GROWTH 1 DAY  Final   Report Status 12/29/2014 FINAL  Final  Culture, blood (routine x 2)     Status: None   Collection Time: 12/28/14  5:39 PM  Result Value Ref Range Status   Specimen Description BLOOD RIGHT FOREARM  Final   Special Requests BOTTLES DRAWN AEROBIC AND ANAEROBIC 5CC  Final   Culture NO GROWTH 5 DAYS  Final   Report Status 01/02/2015 FINAL  Final  Culture, blood (routine x 2)     Status: None   Collection Time: 12/28/14  5:40 PM  Result Value Ref Range Status   Specimen Description BLOOD RIGHT HAND  Final   Special Requests BOTTLES DRAWN AEROBIC AND ANAEROBIC 3ML  Final   Culture NO GROWTH 5 DAYS  Final   Report Status 01/02/2015 FINAL  Final    Medical History: Past Medical History  Diagnosis Date  . Hypercholesterolemia      Medications:   (Not in a hospital admission) Assessment: [age] yoM presenting with recurrent fevers (tmax 104.9 at home), flank/mid back pain, recently placed on doxycycline and ciprofloxacin by PCP for suspected prostatitis. Found to have sepsis secondary to colonic diverticular abscess and liver abscess. Will have drain placed by IR and be initiated on empiric vancomycin, Zosyn and Flagyl.   WBC 16.8, tmax 101.2, SCr 1.05, CrCl 86  10/12 Flagyl >> 10/12 Zosyn >> 10/12 vanc >>  10/12 BCx x2 >>  Goal of Therapy:  Vancomycin trough level 15-20 mcg/ml  Plan:  Flagyl 500 mg IV q8h Zosyn 3.375 g IV q8h Vancomycin 2000 mg IV x1 (~25 mg/kg load) Vancomycin 1000 mg IV q8h Expected duration 7 days with resolution of temperature and/or normalization of WBC Measure antibiotic drug levels at steady state Follow up culture results, LOT, surgical plans, resolution of fevers  Arcola JanskyMeagan Delyle Weider, PharmD Clinical Pharmacy Resident Pager: [phone] 01/07/2015,5:40 PM

## 2015-01-07 NOTE — ED Provider Notes (Signed)
CSN: [id number]     Arrival date & time 01/07/15  1025 History   First MD Initiated Contact with Patient 01/07/15 1055     Chief Complaint  Patient presents with  . Fever  . Back Pain     (Consider location/radiation/quality/duration/timing/severity/associated sxs/prior Treatment) The history is provided by the patient, a friend and a relative.   Bobby Roth is a 54 y.o. male with no significant past medical history presenting now with a 2 week history of fevers up to 104.9 at the highest, originally felt to be flu-like viral origin at his visit here 12/28/14.  He has been seen by his pcp most recently 2 days ago when he was placed on empiric Cipro and again this am.  He continues to have fevers spiking to 102 yesterday and he woke this am with right flank pain, therefore pcp sent him here for repeat labs and CT scan to assess for possible liver source of fever and pain.  Prior to today he denies pain symptoms, including abdominal pain, nausea, vomiting, back pain, dysuria, diarrhea or vomiting.  He endorses a vague low pelvic pressure sensation since this spring felt to be related to an enlarged prostate.  He has alternated tylenol and ibuprofen at home, last dose early this am and his fever responds appropriately.  [place], [place] this [place], no other foreign travel. No other ill household members.       Past Medical History  Diagnosis Date  . Hypercholesterolemia    Past Surgical History  Procedure Laterality Date  . Tonsillectomy    . Pilonidal cyst excision     No family history on file. Social History  Substance Use Topics  . Smoking status: Current Every Day Smoker    Types: Cigarettes  . Smokeless tobacco: None  . Alcohol Use: Yes    Review of Systems  Constitutional: Positive for fever, chills and appetite change.  HENT: Negative.  Negative for sore throat.   Eyes: Negative.   Respiratory: Negative for cough, chest tightness and  shortness of breath.   Cardiovascular: Negative for chest pain and palpitations.  Gastrointestinal: Negative for nausea and abdominal pain.       Negative except as mentioned in HPI.    Genitourinary: Positive for frequency. Negative for dysuria, urgency, hematuria and discharge.       Negative except as mentioned in HPI.    Musculoskeletal: Negative for joint swelling, arthralgias and neck pain.  Skin: Negative.  Negative for rash and wound.  Neurological: Negative for dizziness, weakness, light-headedness, numbness and headaches.  Psychiatric/Behavioral: Negative.       Allergies  Review of patient's allergies indicates no known allergies.  Home Medications   Prior to Admission medications   Medication Sig Start Date End Date Taking? Authorizing Provider  acetaminophen (TYLENOL) 500 MG tablet Take 500 mg by mouth every 6 (six) hours as needed for fever.   Yes Historical Provider, MD  aspirin EC 81 MG tablet Take 81 mg by mouth daily.   Yes Historical Provider, MD  atorvastatin (LIPITOR) 20 MG tablet Take 20 mg by mouth at bedtime. 12/22/14  Yes Historical Provider, MD  Cholecalciferol (VITAMIN D) 2000 UNITS tablet Take 2,000 Units by mouth daily.   Yes Historical Provider, MD  ciprofloxacin (CIPRO) 500 MG tablet Take 500 mg by mouth 2 (two) times daily. 01/05/15 01/15/15 Yes Historical Provider, MD  ibuprofen (ADVIL,MOTRIN) 200 MG tablet Take 400-600 mg by mouth 2 (two) times  daily as needed for fever (pain).   Yes Historical Provider, MD  omeprazole (PRILOSEC) 20 MG capsule Take 20 mg by mouth daily. 10/28/14  Yes Historical Provider, MD  PARoxetine (PAXIL) 20 MG tablet Take 20 mg by mouth daily. 12/18/14  Yes Historical Provider, MD  tamsulosin (FLOMAX) 0.4 MG CAPS capsule Take 0.4 mg by mouth at bedtime. 12/16/14  Yes Historical Provider, MD   BP 95/47 mmHg  Pulse 80  Temp(Src) 100.4 F (38 C) (Oral)  Resp 20  SpO2 97% Physical Exam  Constitutional: He is oriented to person,  place, and time. He appears well-developed and well-nourished.  HENT:  Head: Normocephalic and atraumatic.  Eyes: Conjunctivae are normal.  Neck: Normal range of motion. Neck supple.  Cardiovascular: Normal rate, regular rhythm, normal heart sounds and intact distal pulses.   Pulmonary/Chest: Effort normal and breath sounds normal. He has no wheezes.  Abdominal: Soft. Bowel sounds are normal. He exhibits no distension. There is no tenderness. There is no guarding.  Genitourinary: Prostate normal.  No prostate tenderness.  Chaperone was present during exam.   Musculoskeletal: Normal range of motion.  Lymphadenopathy:    He has no cervical adenopathy.  Neurological: He is alert and oriented to person, place, and time.  Skin: Skin is warm and dry.  Psychiatric: He has a normal mood and affect.  Nursing note and vitals reviewed.   ED Course  Procedures (including critical care time) Labs Review Labs Reviewed  CBC WITH DIFFERENTIAL/PLATELET - Abnormal; Notable for the following:    WBC 16.8 (*)    RBC 3.72 (*)    Hemoglobin 11.5 (*)    HCT 34.2 (*)    Neutro Abs 13.8 (*)    Monocytes Absolute 1.6 (*)    All other components within normal limits  COMPREHENSIVE METABOLIC PANEL - Abnormal; Notable for the following:    Sodium 132 (*)    Chloride 98 (*)    Glucose, Bld 121 (*)    Calcium 8.8 (*)    Albumin 2.8 (*)    AST 56 (*)    ALT 83 (*)    Alkaline Phosphatase 153 (*)    All other components within normal limits  LIPASE, BLOOD - Abnormal; Notable for the following:    Lipase 20 (*)    All other components within normal limits  URINALYSIS, ROUTINE W REFLEX MICROSCOPIC (NOT AT [facility]) - Abnormal; Notable for the following:    Color, Urine AMBER (*)    Bilirubin Urine SMALL (*)    Ketones, ur 15 (*)    Protein, ur 30 (*)    All other components within normal limits  URINE MICROSCOPIC-ADD ON - Abnormal; Notable for the following:    Bacteria, UA FEW (*)    All other  components within normal limits    Imaging Review Ct Abdomen Pelvis W Contrast  01/07/2015  CLINICAL DATA:  Fever of unknown origin. EXAM: CT ABDOMEN AND PELVIS WITH CONTRAST TECHNIQUE: Multidetector CT imaging of the abdomen and pelvis was performed using the standard protocol following bolus administration of intravenous contrast. CONTRAST:  OMNIPAQUE IOHEXOL 300 MG/ML  SOLN COMPARISON:  None. FINDINGS: Lower chest:  Normal. Hepatobiliary: Complex multi septated cystic lesion in the posterior medial aspect of the right lobe of the liver measuring 7.7 x 7.6 x 5.1 cm, most likely a hepatic abscess. Pancreas: Normal. Spleen: Normal. Adrenals/Urinary Tract: Normal adrenal glands. Normal kidneys and ureters. There is a fistula between the sigmoid colon and the dome of  the bladder with an abscess which extends into the wall of the dome of the bladder but does not extend through the mucosa into the bladder cavity. There is a small air-containing 12 mm abscess adjacent to the abscess in the bladder wall. The fistula to the sigmoid portion of the colon is best seen on image 54 of series 6 and image 40 of series 5. This is immediately posterior to the anterior abdominal wall. Stomach/Bowel: Numerous diverticula in the distal colon with a focal fistula to the wall of the dome of the bladder. Terminal ileum and appendix are normal. Vascular/Lymphatic: No significant adenopathy. Aortic and iliac atherosclerosis. Reproductive: Normal. Other: No free air or free fluid. Musculoskeletal: No acute osseous abnormality. Large benign bone island in the head of the left femur with a smaller bone island in the left greater trochanter. Old Schmorl's node in the superior endplate of L2. IMPRESSION: 1. Complex cystic mass in the posterior medial aspect of the right lobe of the liver, most likely a liver abscess. 2. 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 Tiny adjacent 12 mm  abscess containing air. Extensive adjacent sigmoid diverticulosis. Electronically Signed   By: Francene Boyers M.D.   On: 01/07/2015 15:00   I have personally reviewed and evaluated these images and lab results as part of my medical decision-making.   EKG Interpretation None      MDM   Final diagnoses:  Diverticulitis of large intestine with abscess without bleeding  Liver abscess    Patients  labs reviewed.  Radiological studies were viewed, interpreted and considered during the medical decision making and disposition process. Spoke with Dr Jena Gauss regarding imaging findings.  He felt the liver abscess was positioned amenable to percutaneous drainage.  Will consult General surgery re admission.  Dr. Rubin Payor spoke with General Surgery - [facility], [place] - will consult, asks for medical admission.  Will contact unassigned medicine for admission.    Results were also discussed with patient.      Burgess Amor, PA-C 01/07/15 1528  Benjiman Core, MD 01/10/15 [phone]

## 2015-01-07 NOTE — ED Notes (Signed)
Patient transported to CT 

## 2015-01-08 ENCOUNTER — Encounter (HOSPITAL_COMMUNITY): Payer: Self-pay | Admitting: Radiology

## 2015-01-08 ENCOUNTER — Inpatient Hospital Stay (HOSPITAL_COMMUNITY): Payer: BLUE CROSS/BLUE SHIELD

## 2015-01-08 DIAGNOSIS — F1010 Alcohol abuse, uncomplicated: Secondary | ICD-10-CM | POA: Diagnosis present

## 2015-01-08 DIAGNOSIS — K7010 Alcoholic hepatitis without ascites: Secondary | ICD-10-CM | POA: Diagnosis present

## 2015-01-08 DIAGNOSIS — K7030 Alcoholic cirrhosis of liver without ascites: Secondary | ICD-10-CM | POA: Diagnosis present

## 2015-01-08 DIAGNOSIS — A419 Sepsis, unspecified organism: Secondary | ICD-10-CM | POA: Diagnosis present

## 2015-01-08 LAB — CBC
HCT: 31.7 % — ABNORMAL LOW (ref 39.0–52.0)
HEMOGLOBIN: 10.4 g/dL — AB (ref 13.0–17.0)
MCH: 30.3 pg (ref 26.0–34.0)
MCHC: 32.8 g/dL (ref 30.0–36.0)
MCV: 92.4 fL (ref 78.0–100.0)
PLATELETS: 269 10*3/uL (ref 150–400)
RBC: 3.43 MIL/uL — AB (ref 4.22–5.81)
RDW: 14.1 % (ref 11.5–15.5)
WBC: 13.7 10*3/uL — ABNORMAL HIGH (ref 4.0–10.5)

## 2015-01-08 LAB — COMPREHENSIVE METABOLIC PANEL
ALBUMIN: 2.4 g/dL — AB (ref 3.5–5.0)
ALK PHOS: 132 U/L — AB (ref 38–126)
ALT: 67 U/L — ABNORMAL HIGH (ref 17–63)
ANION GAP: 8 (ref 5–15)
AST: 42 U/L — ABNORMAL HIGH (ref 15–41)
BUN: 9 mg/dL (ref 6–20)
CHLORIDE: 101 mmol/L (ref 101–111)
CO2: 25 mmol/L (ref 22–32)
Calcium: 8.4 mg/dL — ABNORMAL LOW (ref 8.9–10.3)
Creatinine, Ser: 1.08 mg/dL (ref 0.61–1.24)
GFR calc non Af Amer: >60 mL/min (ref >60)
GLUCOSE: 127 mg/dL — AB (ref 65–99)
Potassium: 3.9 mmol/L (ref 3.5–5.1)
SODIUM: 134 mmol/L — AB (ref 135–145)
Total Bilirubin: 0.9 mg/dL (ref 0.3–1.2)
Total Protein: 5.6 g/dL — ABNORMAL LOW (ref 6.5–8.1)

## 2015-01-08 LAB — RAPID URINE DRUG SCREEN, HOSP PERFORMED
AMPHETAMINES: NOT DETECTED
Barbiturates: NOT DETECTED
Benzodiazepines: NOT DETECTED
Cocaine: NOT DETECTED
OPIATES: NOT DETECTED
Tetrahydrocannabinol: NOT DETECTED

## 2015-01-08 LAB — PROTIME-INR
INR: 1.25 (ref 0.00–1.49)
PROTHROMBIN TIME: 15.8 s — AB (ref 11.6–15.2)

## 2015-01-08 LAB — GAMMA GT: GGT: 72 U/L — ABNORMAL HIGH (ref 7–50)

## 2015-01-08 LAB — MRSA PCR SCREENING: MRSA BY PCR: NEGATIVE

## 2015-01-08 MED ORDER — MIDAZOLAM HCL 2 MG/2ML IJ SOLN
INTRAMUSCULAR | Status: AC | PRN
Start: 2015-01-08 — End: 2015-01-08
  Administered 2015-01-08 (×2): 0.5 mg via INTRAVENOUS
  Administered 2015-01-08: 1 mg via INTRAVENOUS
  Administered 2015-01-08: 0.5 mg via INTRAVENOUS

## 2015-01-08 MED ORDER — FENTANYL CITRATE (PF) 100 MCG/2ML IJ SOLN
INTRAMUSCULAR | Status: AC | PRN
  Administered 2015-01-08: 25 ug via INTRAVENOUS
  Administered 2015-01-08: 50 ug via INTRAVENOUS
  Administered 2015-01-08 (×2): 25 ug via INTRAVENOUS
  Administered 2015-01-08: 50 ug via INTRAVENOUS

## 2015-01-08 MED ORDER — MIDAZOLAM HCL 2 MG/2ML IJ SOLN
INTRAMUSCULAR | Status: DC
Start: 2015-01-08 — End: 2015-01-08
  Filled 2015-01-08: qty 4

## 2015-01-08 MED ORDER — MIDAZOLAM HCL 2 MG/2ML IJ SOLN
INTRAMUSCULAR | Status: AC
  Filled 2015-01-08: qty 4

## 2015-01-08 MED ORDER — FENTANYL CITRATE (PF) 100 MCG/2ML IJ SOLN
INTRAMUSCULAR | Status: AC
  Filled 2015-01-08: qty 4

## 2015-01-08 MED ORDER — LIDOCAINE HCL 1 % IJ SOLN
INTRAMUSCULAR | Status: AC
  Filled 2015-01-08: qty 20

## 2015-01-08 NOTE — Sedation Documentation (Signed)
Patient is resting comfortably. 

## 2015-01-08 NOTE — Progress Notes (Signed)
Pt off unit to interventional radiology with escort personnel. Family to accompany pt to radiology department. Olegario ShearerBrittany Leotta Weingarten, RN

## 2015-01-08 NOTE — Procedures (Signed)
Interventional Radiology Procedure Note  Procedure:  CT guided drainage of hepatic abscess  Complications: None  Estimated Blood Loss: < 25 mL  Aspiration at level of posterior right lobe hepatic abscess yielded purulent fluid.  Sample sent for culture analysis. 12 Fr percutaneous drain placed and attached to suction bulb drainage.  Good output through drain. Will follow.  Jodi MarbleGlenn T. Fredia SorrowYamagata, M.D Pager:  [phone]

## 2015-01-08 NOTE — Progress Notes (Signed)
[facility] TEAM 1 - Stepdown/ICU TEAM Progress Note  Bobby Roth ZOX:[id number] DOB: [birth year] DOA: 01/07/2015 PCP: Elizabeth Palau, FNP  Admit HPI / Brief Narrative: 54 y.o. WM PMHx Alcohol Abuse, HLD, Diverticulitis  Presenting with FUO. The patient states that he has had dysuria since May. Since then the head is been treated with antibiotics for dysuria but patient reports that since that time he has continued to have dysuria. Patient had multiple visits to the ED secondary to fevers. Workup did not show source as such patient's primary care physician recommended ED evaluation for a more thorough evaluation which was to include CT scan of abdomen and pelvis.  CT scan results reports intramural abscess in the wall of the dome of the bladder with a fistula to the sigmoid colon. There is also most likely a liver abscess. Gen. surgery contacted and recommended admission and into medical surface assay did not currently see any reason to do emergent operation upon initial evaluation.    HPI/Subjective: 10/13 A/O 4, positive abdominal pain, negative N/V, negative SOB, negative CP, positive blood in stool  Assessment/Plan:  Sepsis unspecified organism -Secondary to multiple intra-abdominal abscesses - Continue supportive therapy and antibiotics  -Scheduled for IR drainage of abscesses today  Colonic diverticular abscess -Cover with Zosyn, vancomycin and Flagyl  Liver abscess -10/13 IR scheduled for drainage  - See colonic diverticular abscess   Liver cirrhosis/alcohol liver hepatitis/hepatitis -Obtain GGT -Obtain acute hepatitis panel -Obtain HIV -Obtain anti-LKM -Obtain INR  Alcohol abuse -Long talk with patient and family about need for absolute abstinence   Code Status: FULL Family Communication: no family present at time of exam Disposition Plan: Resolution sepsis    Consultants: Dr.Glenn Yamagata (IR) Dr.Matthew Dwain Sarna (CCS)  Procedure/Significant  Events:    Culture 10/12 blood 10/12 MRSA by PCR negative  Antibiotics: Metronidazole 10/12>> Zosyn 10/12>> Vancomycin 10/12>>   DVT prophylaxis: SCD   Devices   LINES / TUBES:     Continuous Infusions: . dextrose 5 % and 0.9% NaCl 100 mL/hr at 01/08/15 0908    Objective: VITAL SIGNS: Temp: 102.6 F (39.2 C) (10/13 2005) Temp Source: Axillary (10/13 2005) BP: 111/62 mmHg (10/13 2005) Pulse Rate: 83 (10/13 2005) SPO2; FIO2:   Intake/Output Summary (Last 24 hours) at 01/08/15 2013 Last data filed at 01/08/15 1700  Gross per 24 hour  Intake   2595 ml  Output   1550 ml  Net   1045 ml     Exam: General: A/O 4, positive abdominal pain, No acute respiratory distress Eyes: Negative headache, eye pain, double vision,negative scleral hemorrhage ENT: Negative Runny nose, negative ear pain, negative gingival bleeding Neck:  Negative scars, masses, torticollis, lymphadenopathy, JVD Lungs: Clear to auscultation bilaterally without wheezes or crackles Cardiovascular: Regular rate and rhythm without murmur gallop or rub normal S1 and S2 Abdomen: Positive pain to palpation RLQ/LLQ , nondistended, positive soft, bowel sounds, no rebound, no ascites, no appreciable mass Extremities: No significant cyanosis, clubbing, or edema bilateral lower extremities Psychiatric:  Negative depression, negative anxiety, negative fatigue, negative mania  Neurologic:  Cranial nerves II through XII intact, tongue/uvula midline, all extremities muscle strength 5/5, sensation intact throughout, negative dysarthria, negative expressive aphasia, negative receptive aphasia.   Data Reviewed: Basic Metabolic Panel:  Recent Labs Lab 01/07/15 1200 01/07/15 1816 01/08/15 0320  NA 132* 133* 134*  K 4.0 4.3 3.9  CL 98* 98* 101  CO2 GLUCOSE 121* 125* 127*  BUN CREATININE 1.05  1.08 1.08  CALCIUM 8.8* 8.5* 8.4*  MG  --  1.9  --   PHOS  --  3.4  --    Liver Function  Tests:  Recent Labs Lab 01/07/15 1200 01/07/15 1816 01/08/15 0320  AST 56* 51* 42*  ALT 83* 77* 67*  ALKPHOS 153* 136* 132*  BILITOT 0.7 0.9 0.9  PROT 6.5 6.0* 5.6*  ALBUMIN 2.8* 2.6* 2.4*    Recent Labs Lab 01/07/15 1200  LIPASE 20*   No results for input(s): AMMONIA in the last 168 hours. CBC:  Recent Labs Lab 01/07/15 1200 01/07/15 1816 01/08/15 0320  WBC 16.8* 15.8* 13.7*  NEUTROABS 13.8* 12.6*  --   HGB 11.5* 10.8* 10.4*  HCT 34.2* 32.6* 31.7*  MCV 91.9 92.6 92.4  PLT 281 264 269   Cardiac Enzymes: No results for input(s): CKTOTAL, CKMB, CKMBINDEX, TROPONINI in the last 168 hours. BNP (last 3 results) No results for input(s): BNP in the last 8760 hours.  ProBNP (last 3 results) No results for input(s): PROBNP in the last 8760 hours.  CBG: No results for input(s): GLUCAP in the last 168 hours.  Recent Results (from the past 240 hour(s))  Culture, blood (x 2)     Status: None (Preliminary result)   Collection Time: 01/07/15  5:41 PM  Result Value Ref Range Status   Specimen Description BLOOD LEFT ARM  Final   Special Requests BOTTLES DRAWN AEROBIC AND ANAEROBIC 5CC  Final   Culture NO GROWTH < 24 HOURS  Final   Report Status PENDING  Incomplete  Culture, blood (x 2)     Status: None (Preliminary result)   Collection Time: 01/07/15  5:45 PM  Result Value Ref Range Status   Specimen Description BLOOD LEFT HAND  Final   Special Requests BOTTLES DRAWN AEROBIC AND ANAEROBIC 5CC  Final   Culture NO GROWTH < 24 HOURS  Final   Report Status PENDING  Incomplete  MRSA PCR Screening     Status: None   Collection Time: 01/07/15 10:32 PM  Result Value Ref Range Status   MRSA by PCR NEGATIVE NEGATIVE Final    Comment:        The GeneXpert MRSA Assay (FDA approved for NASAL specimens only), is one component of a comprehensive MRSA colonization surveillance program. It is not intended to diagnose MRSA infection nor to guide or monitor treatment for MRSA  infections.      Studies:  Recent x-ray studies have been reviewed in detail by the Attending Physician  Scheduled Meds:  Scheduled Meds: . atorvastatin  20 mg Oral QHS  . fentaNYL      . lidocaine      . metronidazole  500 mg Intravenous Q8H  . midazolam      . pantoprazole  40 mg Oral Daily  . PARoxetine  20 mg Oral Daily  . piperacillin-tazobactam (ZOSYN)  IV  3.375 g Intravenous Q8H  . sodium chloride  3 mL Intravenous Q12H  . tamsulosin  0.4 mg Oral QHS  . vancomycin  1,000 mg Intravenous Q8H    Time spent on care of this patient: 40 mins   WOODS, Roselind MessierURTIS J , MD  [facility] Office  [phone] Pager [phone]  On-Call/Text Page:      [email or web address]      password [email or web address]  If 7PM-7AM, please contact night-coverage [email or web address] Password [email or web address] 01/08/2015, 8:13 PM   LOS: 1 day   Care during the described time interval was provided by me .  I have reviewed this patient's available data, including medical history, events of note, physical examination, and all test results as part of my evaluation. I have personally reviewed and interpreted all radiology studies.   Dia Crawford, MD [phone] Pager

## 2015-01-08 NOTE — Sedation Documentation (Signed)
Vital signs stable. 

## 2015-01-08 NOTE — H&P (Signed)
Chief Complaint: Patient was seen in consultation today for hepatic abscess Chief Complaint  Patient presents with  . Fever  . Back Pain   at the request of [facility] Dr. Cena Benton  Referring Physician(s): [facility] Dr. Cena Benton  History of Present Illness: Bobby Roth is a 54 y.o. male with complaints of dysuria since May, he denies any hesitancy. He was evaluated and this was felt to be prostatitis. He has had intermittent fevers since 2 weeks ago with unknown origin and was placed on Doxycycline and since has also tried Cipro without relief. He started to develop right flank pain 2 days and with continued fevers and presented to ED. Imaging revealed liver abscess and possible colovesical fistula. He does admit to history of diverticulitis 6-8 years ago. He also complains of weight loss of 7 lbs last 2 weeks. IR received request for image guided hepatic abscess drain placement. He denies any chest pain, shortness of breath or palpitations. He denies any current nausea, lightheadedness or dizziness. He denies any active signs of bleeding or excessive bruising. The patient denies any history of sleep apnea or chronic oxygen use. He has previously tolerated sedation without complications.    Past Medical History  Diagnosis Date  . Hypercholesterolemia     Past Surgical History  Procedure Laterality Date  . Tonsillectomy    . Pilonidal cyst excision      Allergies: Review of patient's allergies indicates no known allergies.  Medications: Prior to Admission medications   Medication Sig Start Date End Date Taking? Authorizing Provider  acetaminophen (TYLENOL) 500 MG tablet Take 500 mg by mouth every 6 (six) hours as needed for fever.   Yes Historical Provider, MD  aspirin EC 81 MG tablet Take 81 mg by mouth daily.   Yes Historical Provider, MD  atorvastatin (LIPITOR) 20 MG tablet Take 20 mg by mouth at bedtime. 12/22/14  Yes Historical Provider, MD  Cholecalciferol (VITAMIN D) 2000 UNITS tablet  Take 2,000 Units by mouth daily.   Yes Historical Provider, MD  ciprofloxacin (CIPRO) 500 MG tablet Take 500 mg by mouth 2 (two) times daily. 01/05/15 01/15/15 Yes Historical Provider, MD  ibuprofen (ADVIL,MOTRIN) 200 MG tablet Take 400-600 mg by mouth 2 (two) times daily as needed for fever (pain).   Yes Historical Provider, MD  omeprazole (PRILOSEC) 20 MG capsule Take 20 mg by mouth daily. 10/28/14  Yes Historical Provider, MD  PARoxetine (PAXIL) 20 MG tablet Take 20 mg by mouth daily. 12/18/14  Yes Historical Provider, MD  tamsulosin (FLOMAX) 0.4 MG CAPS capsule Take 0.4 mg by mouth at bedtime. 12/16/14  Yes Historical Provider, MD     History reviewed. No pertinent family history.  Social History   Social History  . Marital Status: Married    Spouse Name: N/A  . Number of Children: N/A  . Years of Education: N/A   Social History Main Topics  . Smoking status: Current Every Day Smoker -- 1.00 packs/day    Types: Cigarettes  . Smokeless tobacco: None  . Alcohol Use: 33.6 oz/week    56 Shots of liquor per week     Comment: 3-4 drinks a day with 2-3 shots in each drink  . Drug Use: No  . Sexual Activity: Not Asked   Other Topics Concern  . None   Social History Narrative   Review of Systems: A 12 point ROS discussed and pertinent positives are indicated in the HPI above.  All other systems are negative.  Review of Systems  Vital Signs: BP 99/61 mmHg  Pulse 91  Temp(Src) 99.1 F (37.3 C) (Oral)  Resp 30  Ht  (1.753 m)  Wt 181 lb 7 oz (82.3 kg)  BMI 26.78 kg/m2  SpO2 97%  Physical Exam  Constitutional: He is oriented to person, place, and time. No distress.  HENT:  Head: Normocephalic and atraumatic.  Cardiovascular: Normal rate and regular rhythm.  Exam reveals no gallop and no friction rub.   No murmur heard. Pulmonary/Chest: Effort normal and breath sounds normal. No respiratory distress. He has no wheezes. He has no rales.  Abdominal: Soft. Bowel sounds are  normal. He exhibits no distension.  Minimal TTP lower pelvic region  Neurological: He is alert and oriented to person, place, and time.  Skin: He is not diaphoretic.    Mallampati Score:  MD Evaluation Airway: WNL Heart: WNL Abdomen: WNL Chest/ Lungs: WNL ASA  Classification: 3 Mallampati/Airway Score: Two  Imaging: Dg Chest 2 View  12/28/2014  CLINICAL DATA:  Fever and shortness of breath for 2 days.  Smoker. EXAM: CHEST  2 VIEW COMPARISON:  None. FINDINGS: Lower thoracic spondylosis. Midline trachea. Normal heart size and mediastinal contours. No pleural effusion or pneumothorax. Diffuse peribronchial thickening. No lobar consolidation. IMPRESSION: 1.  No acute cardiopulmonary disease. 2. Mild peribronchial thickening which may relate to chronic bronchitis or smoking. Electronically Signed   By: Jeronimo Greaves M.D.   On: 12/28/2014 18:45   Ct Abdomen Pelvis W Contrast  01/07/2015  CLINICAL DATA:  Fever of unknown origin. EXAM: CT ABDOMEN AND PELVIS WITH CONTRAST TECHNIQUE: Multidetector CT imaging of the abdomen and pelvis was performed using the standard protocol following bolus administration of intravenous contrast. CONTRAST:  OMNIPAQUE IOHEXOL 300 MG/ML  SOLN COMPARISON:  None. FINDINGS: Lower chest:  Normal. Hepatobiliary: Complex multi septated cystic lesion in the posterior medial aspect of the right lobe of the liver measuring 7.7 x 7.6 x 5.1 cm, most likely a hepatic abscess. Pancreas: Normal. Spleen: Normal. Adrenals/Urinary Tract: Normal adrenal glands. Normal kidneys and ureters. 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 There is a small air-containing 12 mm abscess adjacent to the abscess in the bladder wall. The fistula to the sigmoid portion of the colon is best seen on image 54 of series 6 and image 40 of  series 5. This is immediately posterior to the anterior abdominal wall. Stomach/Bowel: Numerous diverticula in the distal colon with a focal fistula to the wall of the dome of the bladder. Terminal ileum and appendix are normal. Vascular/Lymphatic: No significant adenopathy. Aortic and iliac atherosclerosis. Reproductive: Normal. Other: No free air or free fluid. Musculoskeletal: No acute osseous abnormality. Large benign bone island in the head of the left femur with a smaller bone island in the left greater trochanter. Old Schmorl's node in the superior endplate of L2. IMPRESSION: 1. Complex cystic mass in the posterior medial aspect of the right lobe of the liver, most likely a liver abscess. 2. 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 Tiny adjacent 12 mm abscess containing air. Extensive adjacent sigmoid diverticulosis. Electronically Signed   By: Francene Boyers M.D.   On: 01/07/2015 15:00   Dg Chest Port 1 View  01/07/2015  CLINICAL DATA:  54 year old male with sepsis and fever for 2 weeks. EXAM: PORTABLE CHEST 1 VIEW COMPARISON:  12/28/2014 chest radiograph FINDINGS: The cardiomediastinal silhouette is unremarkable. There is no evidence of focal airspace disease, pulmonary edema, suspicious pulmonary nodule/mass, pleural effusion, or pneumothorax. No acute bony abnormalities are identified. Remote bilateral rib fractures are identified. IMPRESSION: No active disease. Electronically Signed   By: Harmon PierJeffrey  Hu M.D.   On: 01/07/2015 18:03    Labs:  CBC:  Recent Labs  12/28/14 1735 01/07/15 1200 01/07/15 1816 01/08/15 0320  WBC 7.1 16.8* 15.8* 13.7*  HGB 13.8 11.5* 10.8* 10.4*  HCT 42.4 34.2* 32.6* 31.7*  PLT 128* 281 264 269    COAGS:  Recent Labs  01/07/15 1816 01/07/15 2245  INR  --  1.16  APTT 39*  --     BMP:  Recent Labs  12/28/14 1735 01/07/15 1200 01/07/15 1816 01/08/15 0320  NA 136 132* 133* 134*  K 4.5 4.0 4.3  3.9  CL 103 98* 98* 101  CO2 21* 24 24 25   GLUCOSE 124* 121* 125* 127*  BUN 17 13 11 9   CALCIUM 9.0 8.8* 8.5* 8.4*  CREATININE 1.39* 1.05 1.08 1.08  GFRNONAA 57* >60 >60 >60  GFRAA >60 >60 >60 >60    LIVER FUNCTION TESTS:  Recent Labs  12/28/14 1735 01/07/15 1200 01/07/15 1816 01/08/15 0320  BILITOT 0.9 0.7 0.9 0.9  AST 41 56* 51* 42*  ALT 39 83* 77* 67*  ALKPHOS 102 153* 136* 132*  PROT 6.3* 6.5 6.0* 5.6*  ALBUMIN 3.7 2.8* 2.6* 2.4*    Assessment and Plan: Dysuria felt to be prostatitis FUO x 2 weeks intermittently- placed on Doxycycline and later Cipro  Weight loss 7 lbs last 2 weeks History of diverticulitis 6-8 years ago Right flank pain 2 days ago, imaging revealed Liver abscess, Leukocytosis on IV Zosyn, Vancomycin and Flagyl  Imaging with possible colovesical fistula- CCS following Request for image guided drain placement with sedation The patient has been NPO, no blood thinners taken, labs and vitals have been reviewed. Risks and Benefits discussed with the patient including bleeding, infection, diaphragm irritation, pneumothorax, or damage to adjacent structures All of the patient's questions were answered, patient is agreeable to proceed. Consent signed and in chart.    Thank you for this interesting consult.  I greatly enjoyed meeting [place] and look forward to participating in their care.  A copy of this report was sent to the requesting provider on this date.  SignedBerneta Levins: Arine Foley D 01/08/2015, 9:43 AM   I spent a total of 20 Minutes in face to face in clinical consultation, greater than 50% of which was counseling/coordinating care for hepatic abscess.

## 2015-01-09 LAB — COMPREHENSIVE METABOLIC PANEL
ALK PHOS: 108 U/L (ref 38–126)
ALT: 73 U/L — AB (ref 17–63)
AST: 63 U/L — AB (ref 15–41)
Albumin: 2.2 g/dL — ABNORMAL LOW (ref 3.5–5.0)
Anion gap: 8 (ref 5–15)
BILIRUBIN TOTAL: 0.7 mg/dL (ref 0.3–1.2)
BUN: 7 mg/dL (ref 6–20)
CALCIUM: 8.3 mg/dL — AB (ref 8.9–10.3)
CO2: 25 mmol/L (ref 22–32)
CREATININE: 0.9 mg/dL (ref 0.61–1.24)
Chloride: 101 mmol/L (ref 101–111)
GFR calc Af Amer: >60 mL/min (ref >60)
Glucose, Bld: 148 mg/dL — ABNORMAL HIGH (ref 65–99)
POTASSIUM: 3.7 mmol/L (ref 3.5–5.1)
Sodium: 134 mmol/L — ABNORMAL LOW (ref 135–145)
TOTAL PROTEIN: 5.6 g/dL — AB (ref 6.5–8.1)

## 2015-01-09 LAB — CBC WITH DIFFERENTIAL/PLATELET
BASOS ABS: 0 10*3/uL (ref 0.0–0.1)
BASOS PCT: 0 %
EOS ABS: 0 10*3/uL (ref 0.0–0.7)
EOS PCT: 0 %
HCT: 29.1 % — ABNORMAL LOW (ref 39.0–52.0)
Hemoglobin: 9.9 g/dL — ABNORMAL LOW (ref 13.0–17.0)
Lymphocytes Relative: 9 %
Lymphs Abs: 1.2 10*3/uL (ref 0.7–4.0)
MCH: 31.3 pg (ref 26.0–34.0)
MCHC: 34 g/dL (ref 30.0–36.0)
MCV: 92.1 fL (ref 78.0–100.0)
Monocytes Absolute: 1.2 10*3/uL — ABNORMAL HIGH (ref 0.1–1.0)
Monocytes Relative: 9 %
Neutro Abs: 10.9 10*3/uL — ABNORMAL HIGH (ref 1.7–7.7)
Neutrophils Relative %: 82 %
PLATELETS: 249 10*3/uL (ref 150–400)
RBC: 3.16 MIL/uL — AB (ref 4.22–5.81)
RDW: 14.2 % (ref 11.5–15.5)
WBC: 13.3 10*3/uL — AB (ref 4.0–10.5)

## 2015-01-09 LAB — HEPATITIS PANEL, ACUTE
HCV Ab: <0.1 s/co ratio (ref 0.0–0.9)
HEP B C IGM: NEGATIVE
HEP B S AG: NEGATIVE
Hep A IgM: NEGATIVE

## 2015-01-09 LAB — ANTI-MICROSOMAL ANTIBODY LIVER / KIDNEY: LKM1 Ab: 2.6 Units (ref 0.0–20.0)

## 2015-01-09 LAB — URINE CULTURE: Culture: NO GROWTH

## 2015-01-09 LAB — HIV ANTIBODY (ROUTINE TESTING W REFLEX): HIV SCREEN 4TH GENERATION: NONREACTIVE

## 2015-01-09 LAB — PROTIME-INR
INR: 1.26 (ref 0.00–1.49)
PROTHROMBIN TIME: 16 s — AB (ref 11.6–15.2)

## 2015-01-09 MED ORDER — ENOXAPARIN SODIUM 40 MG/0.4ML ~~LOC~~ SOLN
40.0000 mg | SUBCUTANEOUS | Status: DC
  Administered 2015-01-09 – 2015-01-11 (×3): 40 mg via SUBCUTANEOUS
  Filled 2015-01-09 (×3): qty 0.4

## 2015-01-09 MED ORDER — SODIUM CHLORIDE 0.9 % IV SOLN
INTRAVENOUS | Status: DC
  Administered 2015-01-09: 20:00:00 via INTRAVENOUS

## 2015-01-09 NOTE — Progress Notes (Signed)
Subjective: Feels better, does not want food, passing flatus, fever overnight  Objective: Vital signs in last 24 hours: Temp:  [99.1 F (37.3 C)-102.6 F (39.2 C)] 99.5 F (37.5 C) (10/14 0354) Pulse Rate:  [45-91] 81 (10/14 0332) Resp:  [12-35] 27 (10/14 0332) BP: (88-121)/(43-68) 101/64 mmHg (10/14 0332) SpO2:  [90 %-100 %] 93 % (10/14 0332) Last BM Date: 01/06/15  Intake/Output from previous day: 10/13 0701 - 10/14 0700 In: 2070 [P.O.:120; I.V.:1200; IV Piggyback:750] Out: 1230 [Urine:1150; Drains:80] Intake/Output this shift:    GI: drain in place, abdomen is soft and nontender with bs present  Lab Results:   Recent Labs  01/08/15 0320 01/09/15 0252  WBC 13.7* 13.3*  HGB 10.4* 9.9*  HCT 31.7* 29.1*  PLT 269 249   BMET  Recent Labs  01/08/15 0320 01/09/15 0252  NA 134* 134*  K 3.9 3.7  CL 101 101  CO2 25 25  GLUCOSE 127* 148*  BUN 9 7  CREATININE 1.08 0.90  CALCIUM 8.4* 8.3*   PT/INR  Recent Labs  01/08/15 1200 01/09/15 0252  LABPROT 15.8* 16.0*  INR 1.25 1.26   ABG No results for input(s): PHART, HCO3 in the last 72 hours.  Invalid input(s): PCO2, PO2  Studies/Results: Ct Abdomen Pelvis W Contrast  01/07/2015  CLINICAL DATA:  Fever of unknown origin. EXAM: CT ABDOMEN AND PELVIS WITH CONTRAST TECHNIQUE: Multidetector CT imaging of the abdomen and pelvis was performed using the standard protocol following bolus administration of intravenous contrast. CONTRAST:  OMNIPAQUE IOHEXOL 300 MG/ML  SOLN COMPARISON:  None. FINDINGS: Lower chest:  Normal. Hepatobiliary: Complex multi septated cystic lesion in the posterior medial aspect of the right lobe of the liver measuring 7.7 x 7.6 x 5.1 cm, most likely a hepatic abscess. Pancreas: Normal. Spleen: Normal. Adrenals/Urinary Tract: Normal adrenal glands. Normal kidneys and ureters. There is a fistula between the sigmoid colon and the dome of the bladder with an abscess which extends into the wall  of the dome of the bladder but does not extend through the mucosa into the bladder cavity. There is a small air-containing 12 mm abscess adjacent to the abscess in the bladder wall. The fistula to the sigmoid portion of the colon is best seen on image 54 of series 6 and image 40 of series 5. This is immediately posterior to the anterior abdominal wall. Stomach/Bowel: Numerous diverticula in the distal colon with a focal fistula to the wall of the dome of the bladder. Terminal ileum and appendix are normal. Vascular/Lymphatic: No significant adenopathy. Aortic and iliac atherosclerosis. Reproductive: Normal. Other: No free air or free fluid. Musculoskeletal: No acute osseous abnormality. Large benign bone island in the head of the left femur with a smaller bone island in the left greater trochanter. Old Schmorl's node in the superior endplate of L2. IMPRESSION: 1. Complex cystic mass in the posterior medial aspect of the right lobe of the liver, most likely a liver abscess. 2. 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 Tiny adjacent 12 mm abscess containing air. Extensive adjacent sigmoid diverticulosis. Electronically Signed   By: Francene Boyers M.D.   On: 01/07/2015 15:00   Dg Chest Port 1 View  01/07/2015  CLINICAL DATA:  54 year old male with sepsis and fever for 2 weeks. EXAM: PORTABLE CHEST 1 VIEW COMPARISON:  12/28/2014 chest radiograph FINDINGS: The cardiomediastinal silhouette is unremarkable. There is no evidence of focal airspace disease, pulmonary edema, suspicious  pulmonary nodule/mass, pleural effusion, or pneumothorax. No acute bony abnormalities are identified. Remote bilateral rib fractures are identified. IMPRESSION: No active disease. Electronically Signed   By: Harmon PierJeffrey  Hu M.D.   On: 01/07/2015 18:03   Ct Image Guided Drainage By Percutaneous Catheter  01/08/2015  CLINICAL DATA:  Hepatic abscess in the posterior right lobe requiring  percutaneous drainage. EXAM: CT GUIDED DRAINAGE OF HEPATIC ABSCESS ANESTHESIA/SEDATION: 2.5 Mg IV Versed 175 mcg IV Fentanyl Total Moderate Sedation Time:  48 minutes PROCEDURE: The procedure, risks, benefits, and alternatives were explained to the patient. Questions regarding the procedure were encouraged and answered. The patient understands and consents to the procedure. A time-out was performed prior to the procedure. The right post a lateral abdominal wall was prepped with Betadine in a sterile fashion, and a sterile drape was applied covering the operative field. A sterile gown and sterile gloves were used for the procedure. Local anesthesia was provided with 1% Lidocaine. The patient was initially placed in a prone position. The right side was rolled up slightly and CT localization performed of the liver. Under CT guidance, an 18 gauge trocar needle was advanced to the level of a posterior right hepatic abscess. After confirming needle tip position, a fluid sample was withdrawn. A guidewire was then advanced. The percutaneous tract was dilated over the wire and a 12 French percutaneous drainage catheter advanced. Catheter positioning was confirmed by CT. The catheter was flushed and connected to suction bulb drainage. The catheter was secured at the skin with a Prolene retention suture and StatLock device. COMPLICATIONS: None FINDINGS: Aspiration at the level of a dominant loculation of the posterior right hepatic abscess yielded grossly purulent fluid. After placement of the drainage catheter, there is good return of fluid. IMPRESSION: CT-guided percutaneous drainage performed of posterior right hepatic abscess with placement of a 12 French drain. The drain was connected to suction bulb drainage. A sample of purulent fluid was sent for culture analysis. Electronically Signed   By: Irish LackGlenn  Yamagata M.D.   On: 01/08/2015 17:32    Anti-infectives: Anti-infectives    Start     Dose/Rate Route Frequency  Ordered Stop   01/08/15 0200  vancomycin (VANCOCIN) IVPB 1000 mg/200 mL premix     1,000 mg 200 mL/hr over 60 Minutes Intravenous Every 8 hours 01/07/15 1738     01/08/15 0000  piperacillin-tazobactam (ZOSYN) IVPB 3.375 g     3.375 g 12.5 mL/hr over 240 Minutes Intravenous Every 8 hours 01/07/15 1738     01/07/15 1745  vancomycin (VANCOCIN) 2,000 mg in sodium chloride 0.9 % 500 mL IVPB     2,000 mg 250 mL/hr over 120 Minutes Intravenous  Once 01/07/15 1733 01/07/15 2123   01/07/15 1730  metroNIDAZOLE (FLAGYL) IVPB 500 mg     500 mg 100 mL/hr over 60 Minutes Intravenous Every 8 hours 01/07/15 1729     01/07/15 1730  piperacillin-tazobactam (ZOSYN) IVPB 3.375 g  Status:  Discontinued     3.375 g 100 mL/hr over 30 Minutes Intravenous  Once 01/07/15 1724 01/07/15 1725   01/07/15 1530  piperacillin-tazobactam (ZOSYN) IVPB 3.375 g     3.375 g 12.5 mL/hr over 240 Minutes Intravenous  Once 01/07/15 1522 01/07/15 1717      Assessment/Plan: 1. Liver abscess, likely secondary to diverticular disease -cont drain, likely rescan early next week -recommend IV abx therapy 2. Possible colovesical fistula, h/o diverticulitis -patient had diverticulitis 6-8 years ago and suspect the finding of possible fistula is secondary to  this. -he is not having any symptoms such as a dirty UA, pneumaturia, or voiding stool. He will still likely require a sigmoid colectomy at some point in the future, but this would not be done until the liver infection has completed resolved. I dont think current issues are from active diverticulitis and they should be treated with abx anyways.  Will put on fulls today and then plan on advancing to low residue diet over next couple days  [facility] 01/09/2015

## 2015-01-09 NOTE — Care Management Note (Signed)
Case Management Note  Patient Details  Name: [place] Blasndres von [place] MRN: [id number] Date of Birth: 10/22/1960  Subjective/Objective:   Adm w diverticular abscess                 Action/Plan: lives w wife, pcp dr t Dareen Pianoanderson   Expected Discharge Date:                  Expected Discharge Plan:     In-House Referral:     Discharge planning Services     Post Acute Care Choice:    Choice offered to:     DME Arranged:    DME Agency:     HH Arranged:    HH Agency:     Status of Service:     Medicare Important Message Given:    Date Medicare IM Given:    Medicare IM give by:    Date Additional Medicare IM Given:    Additional Medicare Important Message give by:     If discussed at Long Length of Stay Meetings, dates discussed:    Additional Comments: ur review done  Hanley HaysDowell, Dymond Gutt T, RN 01/09/2015, 8:03 AM

## 2015-01-09 NOTE — Progress Notes (Signed)
01/09/2015 Patient had D5 NS running at 100cc hr and fluid was change to NS at 50cc/hr. [facility] LLC[facility]adine Dallys Nowakowski RN.

## 2015-01-09 NOTE — Progress Notes (Signed)
Referring Physician(s): TR[facility]S  Chief Complaint: Hepatic abscess s/p drain 10/13  Subjective: Patient states he feels better overall after drain placement. He does have some pain at drain site with inspiration. He is on liquid diet today and denies nausea.  Allergies: Review of patient's allergies indicates no known allergies.  Medications: Prior to Admission medications   Medication Sig Start Date End Date Taking? Authorizing Provider  acetaminophen (TYLENOL) 500 MG tablet Take 500 mg by mouth every 6 (six) hours as needed for fever.   Yes Historical Provider, MD  aspirin EC 81 MG tablet Take 81 mg by mouth daily.   Yes Historical Provider, MD  atorvastatin (LIPITOR) 20 MG tablet Take 20 mg by mouth at bedtime. 12/22/14  Yes Historical Provider, MD  Cholecalciferol (VITAMIN D) 2000 UNITS tablet Take 2,000 Units by mouth daily.   Yes Historical Provider, MD  ciprofloxacin (CIPRO) 500 MG tablet Take 500 mg by mouth 2 (two) times daily. 01/05/15 01/15/15 Yes Historical Provider, MD  ibuprofen (ADVIL,MOTRIN) 200 MG tablet Take 400-600 mg by mouth 2 (two) times daily as needed for fever (pain).   Yes Historical Provider, MD  omeprazole (PRILOSEC) 20 MG capsule Take 20 mg by mouth daily. 10/28/14  Yes Historical Provider, MD  PARoxetine (PAXIL) 20 MG tablet Take 20 mg by mouth daily. 12/18/14  Yes Historical Provider, MD  tamsulosin (FLOMAX) 0.4 MG CAPS capsule Take 0.4 mg by mouth at bedtime. 12/16/14  Yes Historical Provider, MD   Vital Signs: BP 101/64 mmHg  Pulse 81  Temp(Src) 99.5 F (37.5 C) (Oral)  Resp 27  Ht  (1.753 m)  Wt 181 lb 7 oz (82.3 kg)  BMI 26.78 kg/m2  SpO2 93%  Physical Exam General: A&Ox3, NAD Abd: Soft, NT, ND Right flank drain intact, minimal TTP, no hematoma, serosang with debris output 25 cc in bulb, 105 cc/24 hrs  Imaging: Ct Abdomen Pelvis W Contrast  01/07/2015  CLINICAL DATA:  Fever of unknown origin. EXAM: CT ABDOMEN AND PELVIS WITH CONTRAST  TECHNIQUE: Multidetector CT imaging of the abdomen and pelvis was performed using the standard protocol following bolus administration of intravenous contrast. CONTRAST:  OMNIPAQUE IOHEXOL 300 MG/ML  SOLN COMPARISON:  None. FINDINGS: Lower chest:  Normal. Hepatobiliary: Complex multi septated cystic lesion in the posterior medial aspect of the right lobe of the liver measuring 7.7 x 7.6 x 5.1 cm, most likely a hepatic abscess. Pancreas: Normal. Spleen: Normal. Adrenals/Urinary Tract: Normal adrenal glands. Normal kidneys and ureters. 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 There is a small air-containing 12 mm abscess adjacent to the abscess in the bladder wall. The fistula to the sigmoid portion of the colon is best seen on image 54 of series 6 and image 40 of series 5. This is immediately posterior to the anterior abdominal wall. Stomach/Bowel: Numerous diverticula in the distal colon with a focal fistula to the wall of the dome of the bladder. Terminal ileum and appendix are normal. Vascular/Lymphatic: No significant adenopathy. Aortic and iliac atherosclerosis. Reproductive: Normal. Other: No free air or free fluid. Musculoskeletal: No acute osseous abnormality. Large benign bone island in the head of the left femur with a smaller bone island in the left greater trochanter. Old Schmorl's node in the superior endplate of L2. IMPRESSION: 1. Complex cystic  mass in the posterior medial aspect of the right lobe of the liver, most likely a liver abscess. 2. 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 Tiny adjacent 12 mm abscess containing air. Extensive adjacent sigmoid diverticulosis. Electronically Signed   By: Francene BoyersJames  Maxwell M.D.   On: 01/07/2015 15:00   Dg Chest Port 1 View  01/07/2015   CLINICAL DATA:  54 year old male with sepsis and fever for 2 weeks. EXAM: PORTABLE CHEST 1 VIEW COMPARISON:  12/28/2014 chest radiograph FINDINGS: The cardiomediastinal silhouette is unremarkable. There is no evidence of focal airspace disease, pulmonary edema, suspicious pulmonary nodule/mass, pleural effusion, or pneumothorax. No acute bony abnormalities are identified. Remote bilateral rib fractures are identified. IMPRESSION: No active disease. Electronically Signed   By: Harmon PierJeffrey  Hu M.D.   On: 01/07/2015 18:03   Ct Image Guided Drainage By Percutaneous Catheter  01/08/2015  CLINICAL DATA:  Hepatic abscess in the posterior right lobe requiring percutaneous drainage. EXAM: CT GUIDED DRAINAGE OF HEPATIC ABSCESS ANESTHESIA/SEDATION: 2.5 Mg IV Versed 175 mcg IV Fentanyl Total Moderate Sedation Time:  48 minutes PROCEDURE: The procedure, risks, benefits, and alternatives were explained to the patient. Questions regarding the procedure were encouraged and answered. The patient understands and consents to the procedure. A time-out was performed prior to the procedure. The right post a lateral abdominal wall was prepped with Betadine in a sterile fashion, and a sterile drape was applied covering the operative field. A sterile gown and sterile gloves were used for the procedure. Local anesthesia was provided with 1% Lidocaine. The patient was initially placed in a prone position. The right side was rolled up slightly and CT localization performed of the liver. Under CT guidance, an 18 gauge trocar needle was advanced to the level of a posterior right hepatic abscess. After confirming needle tip position, a fluid sample was withdrawn. A guidewire was then advanced. The percutaneous tract was dilated over the wire and a 12 French percutaneous drainage catheter advanced. Catheter positioning was confirmed by CT. The catheter was flushed and connected to suction bulb drainage. The catheter was secured at the skin with a  Prolene retention suture and StatLock device. COMPLICATIONS: None FINDINGS: Aspiration at the level of a dominant loculation of the posterior right hepatic abscess yielded grossly purulent fluid. After placement of the drainage catheter, there is good return of fluid. IMPRESSION: CT-guided percutaneous drainage performed of posterior right hepatic abscess with placement of a 12 French drain. The drain was connected to suction bulb drainage. A sample of purulent fluid was sent for culture analysis. Electronically Signed   By: Irish LackGlenn  Yamagata M.D.   On: 01/08/2015 17:32    Labs:  CBC:  Recent Labs  01/07/15 1200 01/07/15 1816 01/08/15 0320 01/09/15 0252  WBC 16.8* 15.8* 13.7* 13.3*  HGB 11.5* 10.8* 10.4* 9.9*  HCT 34.2* 32.6* 31.7* 29.1*  PLT 281 264 269 249    COAGS:  Recent Labs  01/07/15 1816 01/07/15 2245 01/08/15 1200 01/09/15 0252  INR  --  1.16 1.25 1.26  APTT 39*  --   --   --     BMP:  Recent Labs  01/07/15 1200 01/07/15 1816 01/08/15 0320 01/09/15 0252  NA 132* 133* 134* 134*  K 4.0 4.3 3.9 3.7  CL 98* 98* 101 101  CO2 24 24 25 25   GLUCOSE 121* 125* 127* 148*  BUN 13 11 9 7   CALCIUM 8.8* 8.5* 8.4* 8.3*  CREATININE  1.05 1.08 1.08 0.90  GFRNONAA >60 >60 >60 >60  GFRAA >60 >60 >60 >60    LIVER FUNCTION TESTS:  Recent Labs  01/07/15 1200 01/07/15 1816 01/08/15 0320 01/09/15 0252  BILITOT 0.7 0.9 0.9 0.7  AST 56* 51* 42* 63*  ALT 83* 77* 67* 73*  ALKPHOS 153* 136* 132* 108  PROT 6.5 6.0* 5.6* 5.6*  ALBUMIN 2.8* 2.6* 2.4* 2.2*    Assessment and Plan: Dysuria felt to be prostatitis  FUO x 2 weeks intermittently- placed on Doxycycline and later Cipro  Weight loss 7 lbs last 2 weeks History of diverticulitis 6-8 years ago Right flank pain, imaging revealed Liver abscess, Leukocytosis on IV Zosyn, Vancomycin and Flagyl  Imaging with possible colovesical fistula- CCS following  S/p posterior right lobe hepatic abscess 12 F drain placement  10/13, good purulent output-sent for Cx/pending, wbc trending down, H/H slight trend down-follow, febrile overnight. Continue to flush and monitor output daily  Signed: Berneta Levins 01/09/2015, 9:16 AM   I spent a total of 15 Minutes at the the patient's bedside AND on the patient's hospital floor or unit, greater than 50% of which was counseling/coordinating care for hepatic abscess

## 2015-01-09 NOTE — Progress Notes (Signed)
[facility] TEAM 1 - Stepdown/ICU TEAM PROGRESS NOTE  [place] Bobby Roth OZH:[id number] DOB: 01/13/1961 DOA: 01/07/2015 PCP: Elizabeth PalauANDERSON,TERESA, FNP  Admit HPI / Brief Narrative: 54 y.o. M Hx Alcohol Abuse, HLD, and Diverticulitis who presented with FUO. The patient stated he had dysuria for months not responding to a course of antibiotics. Patient had multiple visits to the ED secondary to fevers. In the ED a CT noted intramural abscess in the wall of the dome of the bladder with a fistula to the sigmoid colon, and a probable liver abscess.   HPI/Subjective: The patient states he feels much better status post IR drainage of his abscess.  He denies current nausea vomiting or significant abdominal pain.  He denies shortness of breath or chest pain.  Assessment/Plan:  Sepsis unspecified organism -Secondary to multiple intra-abdominal abscesses -Continue supportive therapy and antibiotics   Colonic diverticular abscess w/ colovesical fistula -Cover with Zosyn, vancomycin and Flagyl - Gen Surg suspects he will "require a sigmoid colectomy at some point in the future, but this would not be done until the liver infection has completed resolved"  R posterior multiloculated Liver abscess -10/13 IR drainage   Liver cirrhosis / alcohol liver hepatitis / hepatitis -acute hepatitis panel negative - INR normal - HIV negative   Alcohol abuse -Dr. Joseph ArtWoods had a long talk with patient and family about need for absolute abstinence  Code Status: FULL Family Communication: no family present at time of exam Disposition Plan: SDU   Consultants: IR  Gen Surgery   Procedures: 10/13 - posterior right lobe hepatic abscess 12 F drain placement   Antibiotics: Metronidazole 10/12 > Zosyn 10/12 > Vancomycin 10/12 >  DVT prophylaxis: SCDs  Objective: Blood pressure 97/64, pulse 67, temperature 97.2 F (36.2 C), temperature source Oral, resp. rate 23, height 5\' 9"  (1.753 m), weight 82.3 kg (181 lb 7  oz), SpO2 95 %.  Intake/Output Summary (Last 24 hours) at 01/09/15 1319 Last data filed at 01/09/15 1153  Gross per 24 hour  Intake   1500 ml  Output   2005 ml  Net   -505 ml   Exam: General: No acute respiratory distress Lungs: Clear to auscultation bilaterally without wheezes or crackles Cardiovascular: Regular rate and rhythm without murmur gallop or rub normal S1 and S2 Abdomen: mildly tender to deep palpation RUQ/RLQ, nondistended, soft, bowel sounds positive, no rebound, no ascites, no appreciable mass Extremities: No significant cyanosis, clubbing, or edema bilateral lower extremities  Data Reviewed: Basic Metabolic Panel:  Recent Labs Lab 01/07/15 1200 01/07/15 1816 01/08/15 0320 01/09/15 0252  NA 132* 133* 134* 134*  K 4.0 4.3 3.9 3.7  CL 98* 98* 101 101  CO2 24 24 25 25   GLUCOSE 121* 125* 127* 148*  BUN 13 11 9 7   CREATININE 1.05 1.08 1.08 0.90  CALCIUM 8.8* 8.5* 8.4* 8.3*  MG  --  1.9  --   --   PHOS  --  3.4  --   --     CBC:  Recent Labs Lab 01/07/15 1200 01/07/15 1816 01/08/15 0320 01/09/15 0252  WBC 16.8* 15.8* 13.7* 13.3*  NEUTROABS 13.8* 12.6*  --  10.9*  HGB 11.5* 10.8* 10.4* 9.9*  HCT 34.2* 32.6* 31.7* 29.1*  MCV 91.9 92.6 92.4 92.1  PLT 281 264 269 249    Liver Function Tests:  Recent Labs Lab 01/07/15 1200 01/07/15 1816 01/08/15 0320 01/09/15 0252  AST 56* 51* 42* 63*  ALT 83* 77* 67* 73*  ALKPHOS 153* 136*  132* 108  BILITOT 0.7 0.9 0.9 0.7  PROT 6.5 6.0* 5.6* 5.6*  ALBUMIN 2.8* 2.6* 2.4* 2.2*    Recent Labs Lab 01/07/15 1200  LIPASE 20*   Coags:  Recent Labs Lab 01/07/15 2245 01/08/15 1200 01/09/15 0252  INR 1.16 1.25 1.26    Recent Labs Lab 01/07/15 1816  APTT 39*    Recent Results (from the past 240 hour(s))  Culture, blood (x 2)     Status: None (Preliminary result)   Collection Time: 01/07/15  5:41 PM  Result Value Ref Range Status   Specimen Description BLOOD LEFT ARM  Final   Special Requests  BOTTLES DRAWN AEROBIC AND ANAEROBIC 5CC  Final   Culture NO GROWTH < 24 HOURS  Final   Report Status PENDING  Incomplete  Culture, blood (x 2)     Status: None (Preliminary result)   Collection Time: 01/07/15  5:45 PM  Result Value Ref Range Status   Specimen Description BLOOD LEFT HAND  Final   Special Requests BOTTLES DRAWN AEROBIC AND ANAEROBIC 5CC  Final   Culture NO GROWTH < 24 HOURS  Final   Report Status PENDING  Incomplete  MRSA PCR Screening     Status: None   Collection Time: 01/07/15 10:32 PM  Result Value Ref Range Status   MRSA by PCR NEGATIVE NEGATIVE Final    Comment:        The GeneXpert MRSA Assay (FDA approved for NASAL specimens only), is one component of a comprehensive MRSA colonization surveillance program. It is not intended to diagnose MRSA infection nor to guide or monitor treatment for MRSA infections.   Culture, Urine     Status: None   Collection Time: 01/08/15 12:30 PM  Result Value Ref Range Status   Specimen Description URINE, CLEAN CATCH  Final   Special Requests NONE  Final   Culture NO GROWTH 1 DAY  Final   Report Status 01/09/2015 FINAL  Final  Culture, routine-abscess     Status: None (Preliminary result)   Collection Time: 01/08/15  4:00 PM  Result Value Ref Range Status   Specimen Description ABSCESS  Final   Special Requests ASPIRATE FROM HEPATIC ABSCESS  Final   Gram Stain   Final    ABUNDANT WBC PRESENT,BOTH PMN AND MONONUCLEAR NO SQUAMOUS EPITHELIAL CELLS SEEN FEW GRAM POSITIVE COCCI IN PAIRS Performed at [facility]    Culture PENDING  Incomplete   Report Status PENDING  Incomplete  Anaerobic culture     Status: None (Preliminary result)   Collection Time: 01/08/15  4:00 PM  Result Value Ref Range Status   Specimen Description ABSCESS  Final   Special Requests  ASPIRATE FROM HEPATIC ABSCESS  Final   Gram Stain   Final    ABUNDANT WBC PRESENT,BOTH PMN AND MONONUCLEAR NO SQUAMOUS EPITHELIAL CELLS SEEN RARE GRAM  POSITIVE COCCI IN PAIRS Performed at [facility]    Culture PENDING  Incomplete   Report Status PENDING  Incomplete     Studies:   Recent x-ray studies have been reviewed in detail by the Attending Physician  Scheduled Meds:  Scheduled Meds: . atorvastatin  20 mg Oral QHS  . metronidazole  500 mg Intravenous Q8H  . pantoprazole  40 mg Oral Daily  . PARoxetine  20 mg Oral Daily  . piperacillin-tazobactam (ZOSYN)  IV  3.375 g Intravenous Q8H  . sodium chloride  3 mL Intravenous Q12H  . tamsulosin  0.4 mg Oral QHS  .  vancomycin  1,000 mg Intravenous Q8H    Time spent on care of this patient: 35 mins   Kruze Atchley T , MD   [facility] Office  [phone] Pager - Text Page per Loretha Stapler as per below:  On-Call/Text Page:      [email or web address]      password [email or web address]  If 7PM-7AM, please contact night-coverage [email or web address] Password [email or web address] 01/09/2015, 1:19 PM   LOS: 2 days

## 2015-01-10 LAB — COMPREHENSIVE METABOLIC PANEL
ALBUMIN: 2.2 g/dL — AB (ref 3.5–5.0)
ALK PHOS: 110 U/L (ref 38–126)
ALT: 60 U/L (ref 17–63)
AST: 46 U/L — AB (ref 15–41)
Anion gap: 8 (ref 5–15)
BILIRUBIN TOTAL: 0.5 mg/dL (ref 0.3–1.2)
BUN: 6 mg/dL (ref 6–20)
CALCIUM: 8.5 mg/dL — AB (ref 8.9–10.3)
CO2: 26 mmol/L (ref 22–32)
CREATININE: 0.98 mg/dL (ref 0.61–1.24)
Chloride: 102 mmol/L (ref 101–111)
GFR calc Af Amer: >60 mL/min (ref >60)
GFR calc non Af Amer: >60 mL/min (ref >60)
GLUCOSE: 133 mg/dL — AB (ref 65–99)
Potassium: 3.9 mmol/L (ref 3.5–5.1)
Sodium: 136 mmol/L (ref 135–145)
TOTAL PROTEIN: 5.2 g/dL — AB (ref 6.5–8.1)

## 2015-01-10 LAB — CBC
HEMATOCRIT: 30 % — AB (ref 39.0–52.0)
HEMOGLOBIN: 10.2 g/dL — AB (ref 13.0–17.0)
MCH: 31.5 pg (ref 26.0–34.0)
MCHC: 34 g/dL (ref 30.0–36.0)
MCV: 92.6 fL (ref 78.0–100.0)
Platelets: 295 10*3/uL (ref 150–400)
RBC: 3.24 MIL/uL — ABNORMAL LOW (ref 4.22–5.81)
RDW: 14.4 % (ref 11.5–15.5)
WBC: 10.5 10*3/uL (ref 4.0–10.5)

## 2015-01-10 LAB — VANCOMYCIN, TROUGH: Vancomycin Tr: 16 ug/mL (ref 10.0–20.0)

## 2015-01-10 MED ORDER — MORPHINE SULFATE (PF) 2 MG/ML IV SOLN
2.0000 mg | INTRAVENOUS | Status: DC | PRN
  Administered 2015-01-11: 2 mg via INTRAVENOUS
  Filled 2015-01-10: qty 1

## 2015-01-10 MED ORDER — MENTHOL 3 MG MT LOZG: 1.0000 | LOZENGE | OROMUCOSAL | Status: DC | PRN

## 2015-01-10 MED ORDER — SODIUM CHLORIDE 0.9 % IV SOLN: 250.0000 mL | INTRAVENOUS | Status: DC | PRN

## 2015-01-10 MED ORDER — ONDANSETRON 4 MG PO TBDP
4.0000 mg | ORAL_TABLET | Freq: Four times a day (QID) | ORAL | Status: DC | PRN
  Filled 2015-01-10: qty 2

## 2015-01-10 MED ORDER — ASPIRIN EC 81 MG PO TBEC
81.0000 mg | DELAYED_RELEASE_TABLET | Freq: Every day | ORAL | Status: DC
  Administered 2015-01-10 – 2015-01-12 (×3): 81 mg via ORAL
  Filled 2015-01-10 (×3): qty 1

## 2015-01-10 MED ORDER — ALUM & MAG HYDROXIDE-SIMETH 200-200-20 MG/5ML PO SUSP: 30.0000 mL | Freq: Four times a day (QID) | ORAL | Status: DC | PRN

## 2015-01-10 MED ORDER — DIPHENHYDRAMINE HCL 50 MG/ML IJ SOLN: 12.5000 mg | Freq: Four times a day (QID) | INTRAMUSCULAR | Status: DC | PRN

## 2015-01-10 MED ORDER — LACTATED RINGERS IV BOLUS (SEPSIS): 1000.0000 mL | Freq: Three times a day (TID) | INTRAVENOUS | Status: DC | PRN

## 2015-01-10 MED ORDER — SODIUM CHLORIDE 0.9 % IJ SOLN: 3.0000 mL | INTRAMUSCULAR | Status: DC | PRN

## 2015-01-10 MED ORDER — SACCHAROMYCES BOULARDII 250 MG PO CAPS
250.0000 mg | ORAL_CAPSULE | Freq: Two times a day (BID) | ORAL | Status: DC
  Administered 2015-01-10 – 2015-01-12 (×5): 250 mg via ORAL
  Filled 2015-01-10 (×5): qty 1

## 2015-01-10 MED ORDER — PHENOL 1.4 % MT LIQD
2.0000 | OROMUCOSAL | Status: DC | PRN
  Filled 2015-01-10: qty 177

## 2015-01-10 MED ORDER — MAGIC MOUTHWASH
15.0000 mL | Freq: Four times a day (QID) | ORAL | Status: DC | PRN
  Filled 2015-01-10: qty 15

## 2015-01-10 MED ORDER — BLISTEX MEDICATED EX OINT
TOPICAL_OINTMENT | Freq: Two times a day (BID) | CUTANEOUS | Status: DC
  Administered 2015-01-10 – 2015-01-12 (×5): via TOPICAL
  Filled 2015-01-10: qty 10

## 2015-01-10 MED ORDER — VITAMIN D 1000 UNITS PO TABS
2000.0000 [IU] | ORAL_TABLET | Freq: Every day | ORAL | Status: DC
  Administered 2015-01-10 – 2015-01-12 (×3): 2000 [IU] via ORAL
  Filled 2015-01-10 (×3): qty 2

## 2015-01-10 MED ORDER — ACETAMINOPHEN 500 MG PO TABS: 500.0000 mg | ORAL_TABLET | Freq: Four times a day (QID) | ORAL | Status: DC | PRN

## 2015-01-10 MED ORDER — OXYCODONE HCL 5 MG PO TABS
5.0000 mg | ORAL_TABLET | Freq: Four times a day (QID) | ORAL | Status: DC | PRN
  Administered 2015-01-10 – 2015-01-12 (×3): 10 mg via ORAL
  Filled 2015-01-10 (×3): qty 2

## 2015-01-10 MED ORDER — LIP MEDEX EX OINT: 1.0000 "application " | TOPICAL_OINTMENT | Freq: Two times a day (BID) | CUTANEOUS | Status: DC

## 2015-01-10 MED ORDER — SODIUM CHLORIDE 0.9 % IJ SOLN: 3.0000 mL | Freq: Two times a day (BID) | INTRAMUSCULAR | Status: DC

## 2015-01-10 MED ORDER — VITAMIN D 50 MCG (2000 UT) PO TABS: 2000.0000 [IU] | ORAL_TABLET | Freq: Every day | ORAL | Status: DC

## 2015-01-10 MED ORDER — PROMETHAZINE HCL 25 MG/ML IJ SOLN: 6.2500 mg | INTRAMUSCULAR | Status: DC | PRN

## 2015-01-10 NOTE — Progress Notes (Addendum)
ANTIBIOTIC CONSULT NOTE  Pharmacy Consult for vancomycin and Zosyn Indication: intra-abdominal infection / sepsis  No Known Allergies  Patient Measurements: Height: 5\' 9"  (175.3 cm) Weight: 181 lb 7 oz (82.3 kg) IBW/kg (Calculated) : 70.7   Vital Signs: Temp: 97.9 F (36.6 C) (10/15 1123) Temp Source: Oral (10/15 1123) BP: 101/64 mmHg (10/15 1123) Pulse Rate: 68 (10/15 1123) Intake/Output from previous day: 10/14 0701 - 10/15 0700 In: 1563.3 [P.O.:120; I.V.:443.3; IV Piggyback:1000] Out: 1350 [Urine:1300; Drains:50] Intake/Output from this shift:    Labs:  Recent Labs  01/08/15 0320 01/09/15 0252 01/10/15 0323  WBC 13.7* 13.3* 10.5  HGB 10.4* 9.9* 10.2*  PLT 269 249 295  CREATININE 1.08 0.90 0.98   Estimated Creatinine Clearance: 88.2 mL/min (by C-G formula based on Cr of 0.98).  Recent Labs  01/10/15 1040  VANCOTROUGH 16     Medical History: Past Medical History  Diagnosis Date  . Hypercholesterolemia     Medications:  Prescriptions prior to admission  Medication Sig Dispense Refill Last Dose  . acetaminophen (TYLENOL) 500 MG tablet Take 500 mg by mouth every 6 (six) hours as needed for fever.   01/06/2015 at Unknown time  . aspirin EC 81 MG tablet Take 81 mg by mouth daily.   01/07/2015 at Unknown time  . atorvastatin (LIPITOR) 20 MG tablet Take 20 mg by mouth at bedtime.  2 01/06/2015 at Unknown time  . Cholecalciferol (VITAMIN D) 2000 UNITS tablet Take 2,000 Units by mouth daily.   01/07/2015 at Unknown time  . ibuprofen (ADVIL,MOTRIN) 200 MG tablet Take 400-600 mg by mouth 2 (two) times daily as needed for fever (pain).   01/06/2015 at Unknown time  . omeprazole (PRILOSEC) 20 MG capsule Take 20 mg by mouth daily.  0 01/07/2015 at Unknown time  . PARoxetine (PAXIL) 20 MG tablet Take 20 mg by mouth daily.  0 01/07/2015 at Unknown time  . tamsulosin (FLOMAX) 0.4 MG CAPS capsule Take 0.4 mg by mouth at bedtime.  9 01/06/2015 at Unknown time  .  [DISCONTINUED] ciprofloxacin (CIPRO) 500 MG tablet Take 500 mg by mouth 2 (two) times daily.   01/07/2015 at Unknown time   Assessment: [age] yoM presenting with recurrent fevers (tmax 104.9 at home), flank/mid back pain, recently placed on doxycycline and ciprofloxacin by PCP for suspected prostatitis. Found to have sepsis secondary to colonic diverticular abscess and liver abscess.  VT returned therapeutic. WBC 10.5 , tmax 101.9, SCr 0.98, CrCl 80-90  10/12 Flagyl >> 10/12 Zosyn >> 10/12 vanc >>  10/12 BCx x2 >> ngtd   Goal of Therapy:  Vancomycin trough level 15-20 mcg/ml    Plan:  Zosyn 3.375 g IV q8h Vancomycin 1000 mg IV q8h F/u LOT, surgical plans, resolution of fevers     Agapito GamesAlison Ariell Gunnels, PharmD, BCPS Clinical Pharmacist Pager: [phone] 01/10/2015 12:15 PM

## 2015-01-10 NOTE — Progress Notes (Signed)
Admit HPI / Brief Narrative: 54 y.o. M Hx Alcohol Abuse, HLD, and Diverticulitis who presented with FUO. The patient stated he had dysuria for months not responding to a course of antibiotics. Patient had multiple visits to the ED secondary to fevers. In the ED a CT noted intramural abscess in the wall of the dome of the bladder with a fistula to the sigmoid colon, and a probable liver abscess.    Subjective: Feels better Tol fulls Denies pneumaturia Wants to walk  Objective: Vital signs in last 24 hours: Temp:  [97.8 F (36.6 C)-99.8 F (37.7 C)] 98.4 F (36.9 C) (10/15 0420) Pulse Rate:  [63-78] 63 (10/15 0420) Resp:  [19-29] 20 (10/15 0420) BP: (100-122)/(59-70) 100/68 mmHg (10/15 0420) SpO2:  [94 %-98 %] 94 % (10/15 0420) Last BM Date: 01/06/15  Intake/Output from previous day: 10/14 0701 - 10/15 0700 In: 1583.3 [P.O.:120; I.V.:443.3; IV Piggyback:1000] Out: 1330 [Urine:1300; Drains:30] Intake/Output this shift:   General: Pt awake/alert/oriented x4 in  no major acute distress Eyes: PERRL, normal EOM. Sclera nonicteric Neuro: CN II-XII intact w/o focal sensory/motor deficits. Lymph: No head/neck/groin lymphadenopathy Psych:  No delerium/psychosis/paranoia HENT: Normocephalic, Mucus membranes moist.  No thrush Neck: Supple, No tracheal deviation Chest: No pain.  Good respiratory excursion. CV:  Pulses intact.   Regular rhythm MS: Normal AROM mjr joints.  No obvious deformity Abdomen: Soft, Nondistended.  Nontender.  No incarcerated hernias.  R flank drain w thin purulence Ext:  SCDs BLE.  No significant edema.  No cyanosis Skin: No petechiae / purpura    Lab Results:   Recent Labs  01/09/15 0252 01/10/15 0323  WBC 13.3* 10.5  HGB 9.9* 10.2*  HCT 29.1* 30.0*  PLT 249 295   BMET  Recent Labs  01/09/15 0252 01/10/15 0323  NA 134* 136  K 3.7 3.9  CL 101 102  CO2 25 26  GLUCOSE 148* 133*  BUN 7 6  CREATININE 0.90 0.98  CALCIUM 8.3* 8.5*    PT/INR  Recent Labs  01/08/15 1200 01/09/15 0252  LABPROT 15.8* 16.0*  INR 1.25 1.26   ABG No results for input(s): PHART, HCO3 in the last 72 hours.  Invalid input(s): PCO2, PO2  Studies/Results: Ct Image Guided Drainage By Percutaneous Catheter  01/08/2015  CLINICAL DATA:  Hepatic abscess in the posterior right lobe requiring percutaneous drainage. EXAM: CT GUIDED DRAINAGE OF HEPATIC ABSCESS ANESTHESIA/SEDATION: 2.5 Mg IV Versed 175 mcg IV Fentanyl Total Moderate Sedation Time:  48 minutes PROCEDURE: The procedure, risks, benefits, and alternatives were explained to the patient. Questions regarding the procedure were encouraged and answered. The patient understands and consents to the procedure. A time-out was performed prior to the procedure. The right post a lateral abdominal wall was prepped with Betadine in a sterile fashion, and a sterile drape was applied covering the operative field. A sterile gown and sterile gloves were used for the procedure. Local anesthesia was provided with 1% Lidocaine. The patient was initially placed in a prone position. The right side was rolled up slightly and CT localization performed of the liver. Under CT guidance, an 18 gauge trocar needle was advanced to the level of a posterior right hepatic abscess. After confirming needle tip position, a fluid sample was withdrawn. A guidewire was then advanced. The percutaneous tract was dilated over the wire and a 12 French percutaneous drainage catheter advanced. Catheter positioning was confirmed by CT. The catheter was flushed and connected to suction bulb drainage. The catheter was secured at  the skin with a Prolene retention suture and StatLock device. COMPLICATIONS: None FINDINGS: Aspiration at the level of a dominant loculation of the posterior right hepatic abscess yielded grossly purulent fluid. After placement of the drainage catheter, there is good return of fluid. IMPRESSION: CT-guided percutaneous  drainage performed of posterior right hepatic abscess with placement of a 12 French drain. The drain was connected to suction bulb drainage. A sample of purulent fluid was sent for culture analysis. Electronically Signed   By: Irish Lack M.D.   On: 01/08/2015 17:32    Anti-infectives: Anti-infectives    Start     Dose/Rate Route Frequency Ordered Stop   01/08/15 0200  vancomycin (VANCOCIN) IVPB 1000 mg/200 mL premix     1,000 mg 200 mL/hr over 60 Minutes Intravenous Every 8 hours 01/07/15 1738     01/08/15 0000  piperacillin-tazobactam (ZOSYN) IVPB 3.375 g     3.375 g 12.5 mL/hr over 240 Minutes Intravenous Every 8 hours 01/07/15 1738     01/07/15 1745  vancomycin (VANCOCIN) 2,000 mg in sodium chloride 0.9 % 500 mL IVPB     2,000 mg 250 mL/hr over 120 Minutes Intravenous  Once 01/07/15 1733 01/07/15 2123   01/07/15 1730  metroNIDAZOLE (FLAGYL) IVPB 500 mg     500 mg 100 mL/hr over 60 Minutes Intravenous Every 8 hours 01/07/15 1729     01/07/15 1730  piperacillin-tazobactam (ZOSYN) IVPB 3.375 g  Status:  Discontinued     3.375 g 100 mL/hr over 30 Minutes Intravenous  Once 01/07/15 1724 01/07/15 1725   01/07/15 1530  piperacillin-tazobactam (ZOSYN) IVPB 3.375 g     3.375 g 12.5 mL/hr over 240 Minutes Intravenous  Once 01/07/15 1522 01/07/15 1717      Assessment/Plan:  1. Liver abscess, likely secondary to diverticular disease -cont drain, rescan 5-7 days after drain placement = 10/18-10/20.  May need drain manipulation/upsize w loculated abscess but clinically improving so no need at this time  -recommend IV abx therapy - metronidazole redundant w Zosyn.  Prob go to Zosyn only if not MRSa on Cx.  Then switch to PO Augmentin x 2-6 weeks if Ct scan improved & ready to d/c  2. Probable colovesical fistula, h/o diverticulitis  -patient had diverticulitis 6-8 years ago and suspect the finding of possible fistula is secondary to this.  -he is not having any symptoms such as a dirty  UA, pneumaturia, or voiding stool. He will still likely require a sigmoid colectomy at some point in the future, but this would not be done until the liver infection has completed resolved. I dont think current issues are from active diverticulitis and they should be treated with abx anyways.    Adv diet  Wean IVF  OK to go to floor  EtOH abstinence  PPI for GERD  Antidepressant  Elective colectomy / CV fistula repair 6-weeks after liver abscess healed. I can see at [facility] office at d/c  Consider colonoscopy just prior to colectomy if last one done >2 yrs ago to r/o other issues   Roshan Salamon C. 01/10/2015

## 2015-01-10 NOTE — Progress Notes (Signed)
[facility] TEAM 1 - Stepdown/ICU TEAM PROGRESS NOTE  Bobby Roth OZH:[id number] DOB: 07-Jul-1960 DOA: 01/07/2015 PCP: Bobby Palau, FNP  Admit HPI / Brief Narrative: 54 y.o. M Hx Alcohol Abuse, HLD, and Diverticulitis who presented with FUO. The patient stated he had dysuria for months not responding to a course of antibiotics. Patient had multiple visits to the ED secondary to fevers. In the ED a CT noted an intramural abscess in the wall of the dome of the bladder with a fistula to the sigmoid colon and a probable liver abscess.   HPI/Subjective: The patient states he feels much better today.  He is very anxious to be allowed to get up and walk around at will.  He does report some mild crampy bilateral lower abdominal quadrant pain.  He denies chest pain shortness of breath or right upper quadrant abdominal pain.  There has been no nausea or vomiting and he is tolerating his current diet without difficulty.  Assessment/Plan:  Sepsis unspecified organism -secondary to multiple intra-abdominal abscesses - continue supportive therapy and antibiotics - awaiting speciation from drain cx to help determine if further evaluation indicated and if/when can transition to oral abx   Colonic diverticular abscess w/ colovesical fistula -cont empiric abx - Gen Surg suspects he will "require a sigmoid colectomy at some point in the future, but this would not be done until the liver infection has completed resolved"  R posterior multiloculated Liver abscess -10/13 IR drainage - Gen Surg suggests repeat CT abdom 10/18-10/20 - awaiting culture data - may need ID input depending upon speciation   Liver cirrhosis / alcohol liver hepatitis / hepatitis -acute hepatitis panel negative - INR normal - HIV negative - LFTs have essentially normalized   Alcohol abuse -Bobby Roth had a long talk with patient and family about need for absolute abstinence  Code Status: FULL Family Communication: wife and  daughter present at time of exam  Disposition Plan: transfer to med bed - ambulate - advance diet - await speciation from abscess cx  Consultants: IR  Gen Surgery   Procedures: 10/13 - posterior right lobe hepatic abscess 12 F drain placement   Antibiotics: Metronidazole 10/12 > 10/14 Zosyn 10/12 > Vancomycin 10/12 >  DVT prophylaxis: SCDs  Objective: Blood pressure 101/64, pulse 68, temperature 97.9 F (36.6 C), temperature source Oral, resp. rate 18, height  (1.753 m), weight 82.3 kg (181 lb 7 oz), SpO2 96 %.  Intake/Output Summary (Last 24 hours) at 01/10/15 1639 Last data filed at 01/10/15 1400  Gross per 24 hour  Intake 1053.33 ml  Output     70 ml  Net 983.33 ml   Exam: General: No acute respiratory distress - alert and oriented  Lungs: Clear to auscultation bilaterally without wheezes / crackles Cardiovascular: Regular rate and rhythm without murmur gallop or rub Abdomen: mildly tender to deep palpation RUQ/RLQ, nondistended, soft, bowel sounds positive, no rebound, no ascites, no appreciable mass Extremities: No significant cyanosis, clubbing, edema bilateral lower extremities  Data Reviewed: Basic Metabolic Panel:  Recent Labs Lab 01/07/15 1200 01/07/15 1816 01/08/15 0320 01/09/15 0252 01/10/15 0323  NA 132* 133* 134* 134* 136  K 4.0 4.3 3.9 3.7 3.9  CL 98* 98* 101 101 102  CO2 GLUCOSE 121* 125* 127* 148* 133*  BUN CREATININE 1.05 1.08 1.08 0.90 0.98  CALCIUM 8.8* 8.5* 8.4* 8.3* 8.5*  MG  --  1.9  --   --   --  PHOS  --  3.4  --   --   --     CBC:  Recent Labs Lab 01/07/15 1200 01/07/15 1816 01/08/15 0320 01/09/15 0252 01/10/15 0323  WBC 16.8* 15.8* 13.7* 13.3* 10.5  NEUTROABS 13.8* 12.6*  --  10.9*  --   HGB 11.5* 10.8* 10.4* 9.9* 10.2*  HCT 34.2* 32.6* 31.7* 29.1* 30.0*  MCV 91.9 92.6 92.4 92.1 92.6  PLT 281 264 269 249 295    Liver Function Tests:  Recent Labs Lab 01/07/15 1200 01/07/15 1816  01/08/15 0320 01/09/15 0252 01/10/15 0323  AST 56* 51* 42* 63* 46*  ALT 83* 77* 67* 73* 60  ALKPHOS 153* 136* 132* 108 110  BILITOT 0.7 0.9 0.9 0.7 0.5  PROT 6.5 6.0* 5.6* 5.6* 5.2*  ALBUMIN 2.8* 2.6* 2.4* 2.2* 2.2*    Recent Labs Lab 01/07/15 1200  LIPASE 20*   Coags:  Recent Labs Lab 01/07/15 2245 01/08/15 1200 01/09/15 0252  INR 1.16 1.25 1.26    Recent Labs Lab 01/07/15 1816  APTT 39*    Recent Results (from the past 240 hour(s))  Culture, blood (x 2)     Status: None (Preliminary result)   Collection Time: 01/07/15  5:41 PM  Result Value Ref Range Status   Specimen Description BLOOD LEFT ARM  Final   Special Requests BOTTLES DRAWN AEROBIC AND ANAEROBIC 5CC  Final   Culture NO GROWTH 3 DAYS  Final   Report Status PENDING  Incomplete  Culture, blood (x 2)     Status: None (Preliminary result)   Collection Time: 01/07/15  5:45 PM  Result Value Ref Range Status   Specimen Description BLOOD LEFT HAND  Final   Special Requests BOTTLES DRAWN AEROBIC AND ANAEROBIC 5CC  Final   Culture NO GROWTH 3 DAYS  Final   Report Status PENDING  Incomplete  MRSA PCR Screening     Status: None   Collection Time: 01/07/15 10:32 PM  Result Value Ref Range Status   MRSA by PCR NEGATIVE NEGATIVE Final    Comment:        The GeneXpert MRSA Assay (FDA approved for NASAL specimens only), is one component of a comprehensive MRSA colonization surveillance program. It is not intended to diagnose MRSA infection nor to guide or monitor treatment for MRSA infections.   Culture, Urine     Status: None   Collection Time: 01/08/15 12:30 PM  Result Value Ref Range Status   Specimen Description URINE, CLEAN CATCH  Final   Special Requests NONE  Final   Culture NO GROWTH 1 DAY  Final   Report Status 01/09/2015 FINAL  Final  Culture, routine-abscess     Status: None (Preliminary result)   Collection Time: 01/08/15  4:00 PM  Result Value Ref Range Status   Specimen Description  ABSCESS  Final   Special Requests ASPIRATE FROM HEPATIC ABSCESS  Final   Gram Stain   Final    ABUNDANT WBC PRESENT,BOTH PMN AND MONONUCLEAR NO SQUAMOUS EPITHELIAL CELLS SEEN FEW GRAM POSITIVE COCCI IN PAIRS [facility]    Culture   Final    Culture reincubated for better growth Performed at [facility]    Report Status PENDING  Incomplete  Anaerobic culture     Status: None (Preliminary result)   Collection Time: 01/08/15  4:00 PM  Result Value Ref Range Status   Specimen Description ABSCESS  Final   Special Requests  ASPIRATE FROM HEPATIC ABSCESS  Final  Gram Stain   Final    ABUNDANT WBC PRESENT,BOTH PMN AND MONONUCLEAR NO SQUAMOUS EPITHELIAL CELLS SEEN RARE GRAM POSITIVE COCCI IN PAIRS Performed at [facility]    Culture   Final    NO ANAEROBES ISOLATED; CULTURE IN PROGRESS FOR 5 DAYS Performed at [facility]    Report Status PENDING  Incomplete     Studies:   Recent x-ray studies have been reviewed in detail by the Attending Physician  Scheduled Meds:  Scheduled Meds: . aspirin EC  81 mg Oral Daily  . atorvastatin  20 mg Oral QHS  . cholecalciferol  2,000 Units Oral Daily  . enoxaparin (LOVENOX) injection  40 mg Subcutaneous Q24H  . lip balm   Topical BID  . pantoprazole  40 mg Oral Daily  . PARoxetine  20 mg Oral Daily  . piperacillin-tazobactam (ZOSYN)  IV  3.375 g Intravenous Q8H  . saccharomyces boulardii  250 mg Oral BID  . sodium chloride  3 mL Intravenous Q12H  . tamsulosin  0.4 mg Oral QHS  . vancomycin  1,000 mg Intravenous Q8H    Time spent on care of this patient: 35 mins   MCCLUNG,JEFFREY T , MD   [facility] Office  [phone] Pager - Text Page per Loretha Stapler as per below:  On-Call/Text Page:      [email or web address]      password [email or web address]  If 7PM-7AM, please contact night-coverage [email or web address] Password [email or web address] 01/10/2015, 4:39 PM   LOS: 3 days

## 2015-01-10 NOTE — Progress Notes (Signed)
Patient transferred to 6N15 via wheelchair and handed over to RN

## 2015-01-10 NOTE — Discharge Instructions (Signed)
DRAIN CARE:   You have a closed bulb drain to help you heal.  A bulb drain is a small, plastic reservoir which creates a gentle suction. It is used to remove excess fluid from a surgical wound. The color and amount of fluid will vary. Immediately after surgery, the fluid is bright red. It may gradually change to a yellow color. When the amount decreases to about 1 or 2 tablespoons (15 to 30 cc) per 24 hours, your caregiver will usually remove it.  DAILY CARE  Keep the bulb compressed at all times, except while emptying it. The compression creates suction.   Keep sites where the tubes enter the skin dry and covered with a light bandage (dressing).   Tape the tubes to your skin, 1 to 2 inches below the insertion sites, to keep from pulling on your stitches. Tubes are stitched in place and will not slip out.   Pin the bulb to your shirt (not to your pants) with a safety pin.   For the first few days after surgery, there usually is more fluid in the bulb. Empty the bulb whenever it becomes half full because the bulb does not create enough suction if it is too full. Include this amount in your 24 hour totals.   When the amount of drainage decreases, empty the bulb at the same time every day. Write down the amounts and the 24 hour totals. Your caregiver will want to know them. This helps your caregiver know when the tubes can be removed.   (We anticipate removing the drain in 1-3 weeks, depending on when the output is <55mL a day for 2+ days)  If there is drainage around the tube sites, change dressings and keep the area dry. If you see a clot in the tube, leave it alone. However, if the tube does not appear to be draining, let your caregiver know.  TO EMPTY THE BULB  Open the stopper to release suction.   Holding the stopper out of the way, pour drainage into the measuring cup that was sent home with you.   Measure and write down the amount. If there are 2 bulbs, note the amount of drainage  from bulb 1 or bulb 2 and keep the totals separate. Your caregiver will want to know which tube is draining more.   Compress the bulb by folding it in half.   Replace the stopper.   Check the tape that holds the tube to your skin, and pin the bulb to your shirt.  SEEK MEDICAL CARE IF:  The drainage develops a bad odor.   You have an oral temperature above 102 F (38.9 C).   The amount of drainage from your wound suddenly increases or decreases.   You accidentally pull out your drain.   You have any other questions or concerns.  MAKE SURE YOU:   Understand these instructions.   Will watch your condition.   Will get help right away if you are not doing well or get worse.     Call our office if you have any questions about your drain. [phone]  Diverticulitis Diverticulitis is inflammation or infection of small pouches in your colon that form when you have a condition called diverticulosis. The pouches in your colon are called diverticula. Your colon, or large intestine, is where water is absorbed and stool is formed. Complications of diverticulitis can include:  Bleeding.  Severe infection.  Severe pain.  Perforation of your colon.  Obstruction of your  colon. CAUSES  Diverticulitis is caused by bacteria. Diverticulitis happens when stool becomes trapped in diverticula. This allows bacteria to grow in the diverticula, which can lead to inflammation and infection. RISK FACTORS People with diverticulosis are at risk for diverticulitis. Eating a diet that does not include enough fiber from fruits and vegetables may make diverticulitis more likely to develop. SYMPTOMS  Symptoms of diverticulitis may include:  Abdominal pain and tenderness. The pain is normally located on the left side of the abdomen, but may occur in other areas.  Fever and chills.  Bloating.  Cramping.  Nausea.  Vomiting.  Constipation.  Diarrhea.  Blood in your stool. DIAGNOSIS    Your health care provider will ask you about your medical history and do a physical exam. You may need to have tests done because many medical conditions can cause the same symptoms as diverticulitis. Tests may include:  Blood tests.  Urine tests.  Imaging tests of the abdomen, including X-rays and CT scans. When your condition is under control, your health care provider may recommend that you have a colonoscopy. A colonoscopy can show how severe your diverticula are and whether something else is causing your symptoms. TREATMENT  Most cases of diverticulitis are mild and can be treated at home. Treatment may include:  Taking over-the-counter pain medicines.  Following a clear liquid diet.  Taking antibiotic medicines by mouth for 7-10 days. More severe cases may be treated at a hospital. Treatment may include:  Not eating or drinking.  Taking prescription pain medicine.  Receiving antibiotic medicines through an IV tube.  Receiving fluids and nutrition through an IV tube.  Surgery. HOME CARE INSTRUCTIONS   Follow your health care provider's instructions carefully.  Follow a full liquid diet or other diet as directed by your health care provider. After your symptoms improve, your health care provider may tell you to change your diet. He or she may recommend you eat a high-fiber diet. Fruits and vegetables are good sources of fiber. Fiber makes it easier to pass stool.  Take fiber supplements or probiotics as directed by your health care provider.  Only take medicines as directed by your health care provider.  Keep all your follow-up appointments. SEEK MEDICAL CARE IF:   Your pain does not improve.  You have a hard time eating food.  Your bowel movements do not return to normal. SEEK IMMEDIATE MEDICAL CARE IF:   Your pain becomes worse.  Your symptoms do not get better.  Your symptoms suddenly get worse.  You have a fever.  You have repeated vomiting.  You  have bloody or black, tarry stools. MAKE SURE YOU:   Understand these instructions.  Will watch your condition.  Will get help right away if you are not doing well or get worse.   This information is not intended to replace advice given to you by your health care provider. Make sure you discuss any questions you have with your health care provider.   Document Released: 12/22/2004 Document Revised: 03/19/2013 Document Reviewed: 02/06/2013 Elsevier Interactive Patient Education 2016 [employer]    Abscess An abscess is an infected area that contains a collection of pus and debris.It can occur in almost any part of the body. An abscess is also known as a furuncle or boil. CAUSES  An abscess occurs when tissue gets infected. This can occur from blockage of oil or sweat glands, infection of hair follicles, or a minor injury to the skin. As the body tries to  fight the infection, pus collects in the area and creates pressure under the skin. This pressure causes pain. People with weakened immune systems have difficulty fighting infections and get certain abscesses more often.  SYMPTOMS Usually an abscess develops on the skin and becomes a painful mass that is red, warm, and tender. If the abscess forms under the skin, you may feel a moveable soft area under the skin. Some abscesses break open (rupture) on their own, but most will continue to get worse without care. The infection can spread deeper into the body and eventually into the bloodstream, causing you to feel ill.  DIAGNOSIS  Your caregiver will take your medical history and perform a physical exam. A sample of fluid may also be taken from the abscess to determine what is causing your infection. TREATMENT  Your caregiver may prescribe antibiotic medicines to fight the infection. However, taking antibiotics alone usually does not cure an abscess. Your caregiver may need to make a small cut (incision) in the abscess to drain the pus. In  some cases, gauze is packed into the abscess to reduce pain and to continue draining the area. HOME CARE INSTRUCTIONS   Only take over-the-counter or prescription medicines for pain, discomfort, or fever as directed by your caregiver.  If you were prescribed antibiotics, take them as directed. Finish them even if you start to feel better.  If gauze is used, follow your caregiver's directions for changing the gauze.  To avoid spreading the infection:  Keep your draining abscess covered with a bandage.  Wash your hands well.  Do not share personal care items, towels, or whirlpools with others.  Avoid skin contact with others.  Keep your skin and clothes clean around the abscess.  Keep all follow-up appointments as directed by your caregiver. SEEK MEDICAL CARE IF:   You have increased pain, swelling, redness, fluid drainage, or bleeding.  You have muscle aches, chills, or a general ill feeling.  You have a fever. MAKE SURE YOU:   Understand these instructions.  Will watch your condition.  Will get help right away if you are not doing well or get worse.   This information is not intended to replace advice given to you by your health care provider. Make sure you discuss any questions you have with your health care provider.   Document Released: 12/22/2004 Document Revised: 09/13/2011 Document Reviewed: 05/27/2011 Elsevier Interactive Patient Education 2016 [employer].   GETTING TO GOOD BOWEL HEALTH. Irregular bowel habits such as constipation and diarrhea can lead to many problems over time.  Having one soft bowel movement a day is the most important way to prevent further problems.  The anorectal canal is designed to handle stretching and feces to safely manage our ability to get rid of solid waste (feces, poop, stool) out of our body.  BUT, hard constipated stools can act like ripping concrete bricks and diarrhea can be a burning fire to this very sensitive area of our  body, causing inflamed hemorrhoids, anal fissures, increasing risk is perirectal abscesses, abdominal pain/bloating, an making irritable bowel worse.      The goal: ONE SOFT BOWEL MOVEMENT A DAY!  To have soft, regular bowel movements:   Drink plenty of fluids, consider 4-6 tall glasses of water a day.    Take plenty of fiber.  Fiber is the undigested part of plant food that passes into the colon, acting s natures broom to encourage bowel motility and movement.  Fiber can absorb and hold large  amounts of water. This results in a larger, bulkier stool, which is soft and easier to pass. Work gradually over several weeks up to 6 servings a day of fiber (25g a day even more if needed) in the form of: o Vegetables -- Root (potatoes, carrots, turnips), leafy green (lettuce, salad greens, celery, spinach), or cooked high residue (cabbage, broccoli, etc) o Fruit -- Fresh (unpeeled skin & pulp), Dried (prunes, apricots, cherries, etc ),  or stewed ( applesauce)  o Whole grain breads, pasta, etc (whole wheat)  o Bran cereals   Bulking Agents -- This type of water-retaining fiber generally is easily obtained each day by one of the following:  o Psyllium bran -- The psyllium plant is remarkable because its ground seeds can retain so much water. This product is available as Metamucil, Konsyl, Effersyllium, Per Diem Fiber, or the less expensive generic preparation in drug and health food stores. Although labeled a laxative, it really is not a laxative.  o Methylcellulose -- This is another fiber derived from wood which also retains water. It is available as Citrucel. o Polyethylene Glycol - and artificial fiber commonly called Miralax or Glycolax.  It is helpful for people with gassy or bloated feelings with regular fiber o Flax Seed - a less gassy fiber than psyllium  No reading or other relaxing activity while on the toilet. If bowel movements take longer than 5 minutes, you are too constipated  AVOID  CONSTIPATION.  High fiber and water intake usually takes care of this.  Sometimes a laxative is needed to stimulate more frequent bowel movements, but   Laxatives are not a good long-term solution as it can wear the colon out.  They can help jump-start bowels if constipated, but should be relied on constantly without discussing with your doctor o Osmotics (Milk of Magnesia, Fleets phosphosoda, Magnesium citrate, MiraLax, GoLytely) are safer than  o Stimulants (Senokot, Castor Oil, Dulcolax, Ex Lax)    o Avoid taking laxatives for more than 7 days in a row.   IF SEVERELY CONSTIPATED, try a Bowel Retraining Program: o Do not use laxatives.  o Eat a diet high in roughage, such as bran cereals and leafy vegetables.  o Drink six (6) ounces of prune or apricot juice each morning.  o Eat two (2) large servings of stewed fruit each day.  o Take one (1) heaping tablespoon of a psyllium-based bulking agent twice a day. Use sugar-free sweetener when possible to avoid excessive calories.  o Eat a normal breakfast.  o Set aside 15 minutes after breakfast to sit on the toilet, but do not strain to have a bowel movement.  o If you do not have a bowel movement by the third day, use an enema and repeat the above steps.   Controlling diarrhea o Switch to liquids and simpler foods for a few days to avoid stressing your intestines further. o Avoid dairy products (especially milk & ice cream) for a short time.  The intestines often can lose the ability to digest lactose when stressed. o Avoid foods that cause gassiness or bloating.  Typical foods include beans and other legumes, cabbage, broccoli, and dairy foods.  Every person has some sensitivity to other foods, so listen to our body and avoid those foods that trigger problems for you. o Adding fiber (Citrucel, Metamucil, psyllium, Miralax) gradually can help thicken stools by absorbing excess fluid and retrain the intestines to act more normally.  Slowly increase  the dose over a few  weeks.  Too much fiber too soon can backfire and cause cramping & bloating. o Probiotics (such as active yogurt, Align, etc) may help repopulate the intestines and colon with normal bacteria and calm down a sensitive digestive tract.  Most studies show it to be of mild help, though, and such products can be costly. o Medicines: - Bismuth subsalicylate (ex. Kayopectate, Pepto Bismol) every 30 minutes for up to 6 doses can help control diarrhea.  Avoid if pregnant. - Loperamide (Immodium) can slow down diarrhea.  Start with two tablets (4mg  total) first and then try one tablet every 6 hours.  Avoid if you are having fevers or severe pain.  If you are not better or start feeling worse, stop all medicines and call your doctor for advice o Call your doctor if you are getting worse or not better.  Sometimes further testing (cultures, endoscopy, X-ray studies, bloodwork, etc) may be needed to help diagnose and treat the cause of the diarrhea.  TROUBLESHOOTING IRREGULAR BOWELS 1) Avoid extremes of bowel movements (no bad constipation/diarrhea) 2) Miralax 17gm mixed in 8oz. water or juice-daily. May use BID as needed.  3) Gas-x,Phazyme, etc. as needed for gas & bloating.  4) Soft,bland diet. No spicy,greasy,fried foods.  5) Prilosec over-the-counter as needed  6) May hold gluten/wheat products from diet to see if symptoms improve.  7)  May try probiotics (Align, Activa, etc) to help calm the bowels down 7) If symptoms become worse call back immediately.  Alcohol Abuse and Nutrition Alcohol abuse is any pattern of alcohol consumption that harms your health, relationships, or work. Alcohol abuse can affect how your body breaks down and absorbs nutrients from food by causing your liver to work abnormally. Additionally, many people who abuse alcohol do not eat enough carbohydrates, protein, fat, vitamins, and minerals. This can cause poor nutrition (malnutrition) and a lack of nutrients  (nutrient deficiencies), which can lead to further complications. Nutrients that are commonly lacking (deficient) among people who abuse alcohol include:  Vitamins.  Vitamin A. This is stored in your liver. It is important for your vision, metabolism, and ability to fight off infections (immunity).  B vitamins. These include vitamins such as folate, thiamin, and niacin. These are important in new cell growth and maintenance.  Vitamin C. This plays an important role in iron absorption, wound healing, and immunity.  Vitamin D. This is produced by your liver, but you can also get vitamin D from food. Vitamin D is necessary for your body to absorb and use calcium.  Minerals.  Calcium. This is important for your bones and your heart and blood vessel (cardiovascular) function.  Iron. This is important for blood, muscle, and nervous system functioning.  Magnesium. This plays an important role in muscle and nerve function, and it helps to control blood sugar and blood pressure.  Zinc. This is important for the normal function of your nervous system and digestive system (gastrointestinal tract). Nutrition is an essential component of therapy for alcohol abuse. Your health care provider or dietitian will work with you to design a plan that can help restore nutrients to your body and prevent potential complications. WHAT IS MY PLAN? Your dietitian may develop a specific diet plan that is based on your condition and any other complications you may have. A diet plan will commonly include:  A balanced diet.  Grains: 6-8 oz per day.  Vegetables: 2-3 cups per day.  Fruits: 1-2 cups per day.  Meat and other protein: 5-6 oz  per day.  Dairy: 2-3 cups per day.  Vitamin and mineral supplements. WHAT DO I NEED TO KNOW ABOUT ALCOHOL AND NUTRITION?  Consume foods that are high in antioxidants, such as grapes, berries, nuts, green tea, and dark green and orange vegetables. This can help to counteract  some of the stress that is placed on your liver by consuming alcohol.  Avoid food and drinks that are high in fat and sugar. Foods such as sugared soft drinks, salty snack foods, and candy contain empty calories. This means that they lack important nutrients such as protein, fiber, and vitamins.  Eat frequent meals and snacks. Try to eat 5-6 small meals each day.  Eat a variety of fresh fruits and vegetables each day. This will help you get plenty of water, fiber, and vitamins in your diet.  Drink plenty of water and other clear fluids. Try to drink at least 48-64 oz (1.5-2 L) of water per day.  If you are a vegetarian, eat a variety of protein-rich foods. Pair whole grains with plant-based proteins at meals and snacks to obtain the greatest nutrient benefit from your food. For example, eat rice with beans, put peanut butter on whole-grain toast, or eat oatmeal with sunflower seeds.  Soak beans and whole grains overnight before cooking. This can help your body to absorb the nutrients more easily.  Include foods fortified with vitamins and minerals in your diet. Commonly fortified foods include milk, orange juice, cereal, and bread.  If you are malnourished, your dietitian may recommend a high-protein, high-calorie diet. This may include:  2,000-3,000 calories (kilocalories) per day.  70-100 grams of protein per day.  Your health care provider may recommend a complete nutritional supplement beverage. This can help to restore calories, protein, and vitamins to your body. Depending on your condition, you may be advised to consume this instead of or in addition to meals.  Limit your intake of caffeine. Replace drinks like coffee and black tea with decaffeinated coffee and herbal tea.  Eat a variety of foods that are high in omega fatty acids. These include fish, nuts and seeds, and soybeans. These foods may help your liver to recover and may also stabilize your mood.  Certain medicines may  cause changes in your appetite, taste, and weight. Work with your health care provider and dietitian to make any adjustments to your medicines and diet plan.  Include other healthy lifestyle choices in your daily routine.  Be physically active.  Get enough sleep.  Spend time doing activities that you enjoy.  If you are unable to take in enough food and calories by mouth, your health care provider may recommend a feeding tube. This is a tube that passes through your nose and throat, directly into your stomach. Nutritional supplement beverages can be given to you through the feeding tube to help you get the nutrients you need.  Take vitamin or mineral supplements as recommended by your health care provider. WHAT FOODS CAN I EAT? Grains Enriched pasta. Enriched rice. Fortified whole-grain bread. Fortified whole-grain cereal. Barley. Brown rice. Quinoa. Millet. Vegetables All fresh, frozen, and canned vegetables. Spinach. Kale. Artichoke. Carrots. Winter squash and pumpkin. Sweet potatoes. Broccoli. Cabbage. Cucumbers. Tomatoes. Sweet peppers. Green beans. Peas. Corn. Fruits All fresh and frozen fruits. Berries. Grapes. Mango. Papaya. Guava. Cherries. Apples. Bananas. Peaches. Plums. Pineapple. Watermelon. Cantaloupe. Oranges. Avocado. Meats and Other Protein Sources Beef liver. Lean beef. Pork. Fresh and canned chicken. Fresh fish. Oysters. Sardines. Canned tuna. Shrimp. Eggs with yolks. Nuts  and seeds. Peanut butter. Beans and lentils. Soybeans. Tofu. Dairy Whole, low-fat, and nonfat milk. Whole, low-fat, and nonfat yogurt. Cottage cheese. Sour cream. Hard and soft cheeses. Beverages Water. Herbal tea. Decaffeinated coffee. Decaffeinated green tea. 100% fruit juice. 100% vegetable juice. Instant breakfast shakes. Condiments Ketchup. Mayonnaise. Mustard. Salad dressing. Barbecue sauce. Sweets and Desserts Sugar-free ice cream. Sugar-free pudding. Sugar-free gelatin. Fats and Oils Butter.  Vegetable oil, flaxseed oil, olive oil, and walnut oil. Other Complete nutrition shakes. Protein bars. Sugar-free gum. The items listed above may not be a complete list of recommended foods or beverages. Contact your dietitian for more options. WHAT FOODS ARE NOT RECOMMENDED? Grains Sugar-sweetened breakfast cereals. Flavored instant oatmeal. Fried breads. Vegetables Breaded or deep-fried vegetables. Fruits Dried fruit with added sugar. Candied fruit. Canned fruit in syrup. Meats and Other Protein Sources Breaded or deep-fried meats. Dairy Flavored milks. Fried cheese curds or fried cheese sticks. Beverages Alcohol. Sugar-sweetened soft drinks. Sugar-sweetened tea. Caffeinated coffee and tea. Condiments Sugar. Honey. Agave nectar. Molasses. Sweets and Desserts Chocolate. Cake. Cookies. Candy. Other Potato chips. Pretzels. Salted nuts. Candied nuts. The items listed above may not be a complete list of foods and beverages to avoid. Contact your dietitian for more information.   This information is not intended to replace advice given to you by your health care provider. Make sure you discuss any questions you have with your health care provider.   Document Released: 01/06/2005 Document Revised: 04/04/2014 Document Reviewed: 10/15/2013 Elsevier Interactive Patient Education 2016 [employer].  Alcohol Use Disorder Alcohol use disorder is a mental disorder. It is not a one-time incident of heavy drinking. Alcohol use disorder is the excessive and uncontrollable use of alcohol over time that leads to problems with functioning in one or more areas of daily living. People with this disorder risk harming themselves and others when they drink to excess. Alcohol use disorder also can cause other mental disorders, such as mood and anxiety disorders, and serious physical problems. People with alcohol use disorder often misuse other drugs.  Alcohol use disorder is common and widespread. Some  people with this disorder drink alcohol to cope with or escape from negative life events. Others drink to relieve chronic pain or symptoms of mental illness. People with a family history of alcohol use disorder are at higher risk of losing control and using alcohol to excess.  Drinking too much alcohol can cause injury, accidents, and health problems. One drink can be too much when you are:  Working.  Pregnant or breastfeeding.  Taking medicines. Ask your doctor.  Driving or planning to drive. SYMPTOMS  Signs and symptoms of alcohol use disorder may include the following:   Consumption ofalcohol inlarger amounts or over a longer period of time than intended.  Multiple unsuccessful attempts to cutdown or control alcohol use.   A great deal of time spent obtaining alcohol, using alcohol, or recovering from the effects of alcohol (hangover).  A strong desire or urge to use alcohol (cravings).   Continued use of alcohol despite problems at work, school, or home because of alcohol use.   Continued use of alcohol despite problems in relationships because of alcohol use.  Continued use of alcohol in situations when it is physically hazardous, such as driving a car.  Continued use of alcohol despite awareness of a physical or psychological problem that is likely related to alcohol use. Physical problems related to alcohol use can involve the brain, heart, liver, stomach, and intestines. Psychological problems related to  alcohol use include intoxication, depression, anxiety, psychosis, delirium, and dementia.   The need for increased amounts of alcohol to achieve the same desired effect, or a decreased effect from the consumption of the same amount of alcohol (tolerance).  Withdrawal symptoms upon reducing or stopping alcohol use, or alcohol use to reduce or avoid withdrawal symptoms. Withdrawal symptoms include:  Racing heart.  Hand tremor.  Difficulty  sleeping.  Nausea.  Vomiting.  Hallucinations.  Restlessness.  Seizures. DIAGNOSIS Alcohol use disorder is diagnosed through an assessment by your health care provider. Your health care provider may start by asking three or four questions to screen for excessive or problematic alcohol use. To confirm a diagnosis of alcohol use disorder, at least two symptoms must be present within a 61-month period. The severity of alcohol use disorder depends on the number of symptoms:  Mild--two or three.  Moderate--four or five.  Severe--six or more. Your health care provider may perform a physical exam or use results from lab tests to see if you have physical problems resulting from alcohol use. Your health care provider may refer you to a mental health professional for evaluation. TREATMENT  Some people with alcohol use disorder are able to reduce their alcohol use to low-risk levels. Some people with alcohol use disorder need to quit drinking alcohol. When necessary, mental health professionals with specialized training in substance use treatment can help. Your health care provider can help you decide how severe your alcohol use disorder is and what type of treatment you need. The following forms of treatment are available:   Detoxification. Detoxification involves the use of prescription medicines to prevent alcohol withdrawal symptoms in the first week after quitting. This is important for people with a history of symptoms of withdrawal and for heavy drinkers who are likely to have withdrawal symptoms. Alcohol withdrawal can be dangerous and, in severe cases, cause death. Detoxification is usually provided in a hospital or in-patient substance use treatment facility.  Counseling or talk therapy. Talk therapy is provided by substance use treatment counselors. It addresses the reasons people use alcohol and ways to keep them from drinking again. The goals of talk therapy are to help people with alcohol  use disorder find healthy activities and ways to cope with life stress, to identify and avoid triggers for alcohol use, and to handle cravings, which can cause relapse.  Medicines.Different medicines can help treat alcohol use disorder through the following actions:  Decrease alcohol cravings.  Decrease the positive reward response felt from alcohol use.  Produce an uncomfortable physical reaction when alcohol is used (aversion therapy).  Support groups. Support groups are run by people who have quit drinking. They provide emotional support, advice, and guidance. These forms of treatment are often combined. Some people with alcohol use disorder benefit from intensive combination treatment provided by specialized substance use treatment centers. Both inpatient and outpatient treatment programs are available.   This information is not intended to replace advice given to you by your health care provider. Make sure you discuss any questions you have with your health care provider.   Document Released: 04/21/2004 Document Revised: 04/04/2014 Document Reviewed: 06/21/2012 Elsevier Interactive Patient Education [employer]

## 2015-01-11 DIAGNOSIS — F1010 Alcohol abuse, uncomplicated: Secondary | ICD-10-CM

## 2015-01-11 DIAGNOSIS — K7030 Alcoholic cirrhosis of liver without ascites: Secondary | ICD-10-CM

## 2015-01-11 DIAGNOSIS — K5780 Diverticulitis of intestine, part unspecified, with perforation and abscess without bleeding: Secondary | ICD-10-CM

## 2015-01-11 DIAGNOSIS — A419 Sepsis, unspecified organism: Principal | ICD-10-CM

## 2015-01-11 DIAGNOSIS — K5720 Diverticulitis of large intestine with perforation and abscess without bleeding: Secondary | ICD-10-CM | POA: Insufficient documentation

## 2015-01-11 DIAGNOSIS — K7010 Alcoholic hepatitis without ascites: Secondary | ICD-10-CM

## 2015-01-11 LAB — CBC
HEMATOCRIT: 30.5 % — AB (ref 39.0–52.0)
Hemoglobin: 9.9 g/dL — ABNORMAL LOW (ref 13.0–17.0)
MCH: 30 pg (ref 26.0–34.0)
MCHC: 32.5 g/dL (ref 30.0–36.0)
MCV: 92.4 fL (ref 78.0–100.0)
PLATELETS: 346 10*3/uL (ref 150–400)
RBC: 3.3 MIL/uL — AB (ref 4.22–5.81)
RDW: 14.6 % (ref 11.5–15.5)
WBC: 9.4 10*3/uL (ref 4.0–10.5)

## 2015-01-11 LAB — COMPREHENSIVE METABOLIC PANEL
ALT: 53 U/L (ref 17–63)
AST: 36 U/L (ref 15–41)
Albumin: 2.2 g/dL — ABNORMAL LOW (ref 3.5–5.0)
Alkaline Phosphatase: 134 U/L — ABNORMAL HIGH (ref 38–126)
Anion gap: 7 (ref 5–15)
BUN: 8 mg/dL (ref 6–20)
CHLORIDE: 99 mmol/L — AB (ref 101–111)
CO2: 28 mmol/L (ref 22–32)
CREATININE: 1.03 mg/dL (ref 0.61–1.24)
Calcium: 8.4 mg/dL — ABNORMAL LOW (ref 8.9–10.3)
Glucose, Bld: 127 mg/dL — ABNORMAL HIGH (ref 65–99)
POTASSIUM: 4 mmol/L (ref 3.5–5.1)
SODIUM: 134 mmol/L — AB (ref 135–145)
Total Bilirubin: 0.6 mg/dL (ref 0.3–1.2)
Total Protein: 5.2 g/dL — ABNORMAL LOW (ref 6.5–8.1)

## 2015-01-11 NOTE — Progress Notes (Signed)
PROGRESS NOTE  Bobby Roth ZOX:[id number] DOB: [birth year] DOA: 01/07/2015 PCP: Bobby Palau, FNP  Admit HPI / Brief Narrative: 54 y.o. M Hx Alcohol Abuse, HLD, and Diverticulitis who presented with sepsis. The patient stated he had dysuria for months not responding to a course of antibiotics. Patient had multiple visits to the ED secondary to fevers. In the ED a CT noted an intramural abscess in the wall of the dome of the bladder with a fistula to the sigmoid colon and a probable liver abscess.   HPI/Subjective: The patient states he feels much better today.  He is very anxious to be allowed to get up and walk around at will.  He does report some mild crampy bilateral lower abdominal quadrant pain.  He denies chest pain shortness of breath or right upper quadrant abdominal pain.  There has been no nausea or vomiting and he is tolerating his current diet without difficulty.  Assessment/Plan:  Sepsis -Secondary to multiple intra-abdominal abscesses - continue supportive therapy and antibiotics  -Drainage of the liver abscess showed abundant microaerophilic streptococci. -Currently on Zosyn and vancomycin continue for today. Likely can be discharged in the morning.  Colonic diverticular abscess w/ colovesical fistula -Radiographic evidence of colovesical fistula without having clinical symptoms from it. -No pneumaturia, smelly urine or even the urinalysis did not show that much of bacteria. -Per general surgery patient will need sigmoid colectomy after the liver infection resolves.  Intramural bladder wall abscess -Seen on CT of abdomen pelvis showed  R posterior multiloculated Liver abscess -10/13 IR drainage - Gen Surg suggests repeat CT abdom 10/18-10/20 - awaiting culture data - may need ID input depending upon speciation   Liver cirrhosis / alcohol liver hepatitis / hepatitis -acute hepatitis panel negative - INR normal - HIV negative - LFTs have essentially normalized    Alcohol abuse -Dr. Joseph Art had a long talk with patient and family about need for absolute abstinence  Code Status: FULL Family Communication: wife and daughter present at time of exam  Disposition Plan: transfer to med bed - ambulate - advance diet - await speciation from abscess cx  Consultants: IR  Gen Surgery   Procedures: 10/13 - posterior right lobe hepatic abscess 12 F drain placement   Antibiotics: Metronidazole 10/12 > 10/14 Zosyn 10/12 > Vancomycin 10/12 >  DVT prophylaxis: SCDs  Objective: Blood pressure 100/69, pulse 72, temperature 98.3 F (36.8 C), temperature source Oral, resp. rate 18, height  (1.753 m), weight 82.3 kg (181 lb 7 oz), SpO2 94 %.  Intake/Output Summary (Last 24 hours) at 01/11/15 1317 Last data filed at 01/11/15 0700  Gross per 24 hour  Intake    710 ml  Output     41 ml  Net    669 ml   Exam: General: No acute respiratory distress - alert and oriented  Lungs: Clear to auscultation bilaterally without wheezes / crackles Cardiovascular: Regular rate and rhythm without murmur gallop or rub Abdomen: mildly tender to deep palpation RUQ/RLQ, nondistended, soft, bowel sounds positive, no rebound, no ascites, no appreciable mass Extremities: No significant cyanosis, clubbing, edema bilateral lower extremities  Data Reviewed: Basic Metabolic Panel:  Recent Labs Lab 01/07/15 1816 01/08/15 0320 01/09/15 0252 01/10/15 0323 01/11/15 0322  NA 133* 134* 134* 136 134*  K 4.3 3.9 3.7 3.9 4.0  CL 98* 101 101 102 99*  CO2 GLUCOSE 125* 127* 148* 133* 127*  BUN CREATININE 1.08 1.08  0.90 0.98 1.03  CALCIUM 8.5* 8.4* 8.3* 8.5* 8.4*  MG 1.9  --   --   --   --   PHOS 3.4  --   --   --   --     CBC:  Recent Labs Lab 01/07/15 1200 01/07/15 1816 01/08/15 0320 01/09/15 0252 01/10/15 0323 01/11/15 0322  WBC 16.8* 15.8* 13.7* 13.3* 10.5 9.4  NEUTROABS 13.8* 12.6*  --  10.9*  --   --   HGB 11.5* 10.8* 10.4*  9.9* 10.2* 9.9*  HCT 34.2* 32.6* 31.7* 29.1* 30.0* 30.5*  MCV 91.9 92.6 92.4 92.1 92.6 92.4  PLT 281 264 269 249 295 346    Liver Function Tests:  Recent Labs Lab 01/07/15 1816 01/08/15 0320 01/09/15 0252 01/10/15 0323 01/11/15 0322  AST 51* 42* 63* 46* 36  ALT 77* 67* 73* 60 53  ALKPHOS 136* 132* 108 110 134*  BILITOT 0.9 0.9 0.7 0.5 0.6  PROT 6.0* 5.6* 5.6* 5.2* 5.2*  ALBUMIN 2.6* 2.4* 2.2* 2.2* 2.2*    Recent Labs Lab 01/07/15 1200  LIPASE 20*   Coags:  Recent Labs Lab 01/07/15 2245 01/08/15 1200 01/09/15 0252  INR 1.16 1.25 1.26    Recent Labs Lab 01/07/15 1816  APTT 39*    Recent Results (from the past 240 hour(s))  Culture, blood (x 2)     Status: None (Preliminary result)   Collection Time: 01/07/15  5:41 PM  Result Value Ref Range Status   Specimen Description BLOOD LEFT ARM  Final   Special Requests BOTTLES DRAWN AEROBIC AND ANAEROBIC 5CC  Final   Culture NO GROWTH 3 DAYS  Final   Report Status PENDING  Incomplete  Culture, blood (x 2)     Status: None (Preliminary result)   Collection Time: 01/07/15  5:45 PM  Result Value Ref Range Status   Specimen Description BLOOD LEFT HAND  Final   Special Requests BOTTLES DRAWN AEROBIC AND ANAEROBIC 5CC  Final   Culture NO GROWTH 3 DAYS  Final   Report Status PENDING  Incomplete  MRSA PCR Screening     Status: None   Collection Time: 01/07/15 10:32 PM  Result Value Ref Range Status   MRSA by PCR NEGATIVE NEGATIVE Final    Comment:        The GeneXpert MRSA Assay (FDA approved for NASAL specimens only), is one component of a comprehensive MRSA colonization surveillance program. It is not intended to diagnose MRSA infection nor to guide or monitor treatment for MRSA infections.   Culture, Urine     Status: None   Collection Time: 01/08/15 12:30 PM  Result Value Ref Range Status   Specimen Description URINE, CLEAN CATCH  Final   Special Requests NONE  Final   Culture NO GROWTH 1 DAY  Final    Report Status 01/09/2015 FINAL  Final  Culture, routine-abscess     Status: None (Preliminary result)   Collection Time: 01/08/15  4:00 PM  Result Value Ref Range Status   Specimen Description ABSCESS  Final   Special Requests ASPIRATE FROM HEPATIC ABSCESS  Final   Gram Stain   Final    ABUNDANT WBC PRESENT,BOTH PMN AND MONONUCLEAR NO SQUAMOUS EPITHELIAL CELLS SEEN FEW GRAM POSITIVE COCCI IN PAIRS Performed at [facility]    Culture   Final    ABUNDANT MICROAEROPHILIC STREPTOCOCCI Note: Standardized susceptibility testing for this organism is not available. Performed at [facility]    Report Status PENDING  Incomplete  Anaerobic culture     Status: None (Preliminary result)   Collection Time: 01/08/15  4:00 PM  Result Value Ref Range Status   Specimen Description ABSCESS  Final   Special Requests  ASPIRATE FROM HEPATIC ABSCESS  Final   Gram Stain   Final    ABUNDANT WBC PRESENT,BOTH PMN AND MONONUCLEAR NO SQUAMOUS EPITHELIAL CELLS SEEN RARE GRAM POSITIVE COCCI IN PAIRS Performed at [facility]    Culture   Final    NO ANAEROBES ISOLATED; CULTURE IN PROGRESS FOR 5 DAYS Performed at [facility]    Report Status PENDING  Incomplete     Studies:   Recent x-ray studies have been reviewed in detail by the Attending Physician  Scheduled Meds:  Scheduled Meds: . aspirin EC  81 mg Oral Daily  . atorvastatin  20 mg Oral QHS  . cholecalciferol  2,000 Units Oral Daily  . enoxaparin (LOVENOX) injection  40 mg Subcutaneous Q24H  . lip balm   Topical BID  . pantoprazole  40 mg Oral Daily  . PARoxetine  20 mg Oral Daily  . piperacillin-tazobactam (ZOSYN)  IV  3.375 g Intravenous Q8H  . saccharomyces boulardii  250 mg Oral BID  . tamsulosin  0.4 mg Oral QHS  . vancomycin  1,000 mg Intravenous Q8H    Time spent on care of this patient: 35 mins   Clydia Llano A , MD   [facility] Office  [phone] Pager - Text Page per Loretha Stapler  as per below:  On-Call/Text Page:      [email or web address]      password [email or web address]  If 7PM-7AM, please contact night-coverage [email or web address] Password [email or web address] 01/11/2015, 1:17 PM   LOS: 4 days

## 2015-01-11 NOTE — Progress Notes (Signed)
Referring Physician(s): [facility] Dr Michaell CowingGross  Chief Complaint:  Hepatic abscess  Subjective:  Drain placed 10/13 Doing well Eating full diet Output still significant  Allergies: Review of patient's allergies indicates no known allergies.  Medications: Prior to Admission medications   Medication Sig Start Date End Date Taking? Authorizing Provider  acetaminophen (TYLENOL) 500 MG tablet Take 500 mg by mouth every 6 (six) hours as needed for fever.   Yes Historical Provider, MD  aspirin EC 81 MG tablet Take 81 mg by mouth daily.   Yes Historical Provider, MD  atorvastatin (LIPITOR) 20 MG tablet Take 20 mg by mouth at bedtime. 12/22/14  Yes Historical Provider, MD  Cholecalciferol (VITAMIN D) 2000 UNITS tablet Take 2,000 Units by mouth daily.   Yes Historical Provider, MD  ibuprofen (ADVIL,MOTRIN) 200 MG tablet Take 400-600 mg by mouth 2 (two) times daily as needed for fever (pain).   Yes Historical Provider, MD  omeprazole (PRILOSEC) 20 MG capsule Take 20 mg by mouth daily. 10/28/14  Yes Historical Provider, MD  PARoxetine (PAXIL) 20 MG tablet Take 20 mg by mouth daily. 12/18/14  Yes Historical Provider, MD  tamsulosin (FLOMAX) 0.4 MG CAPS capsule Take 0.4 mg by mouth at bedtime. 12/16/14  Yes Historical Provider, MD     Vital Signs: BP 100/69 mmHg  Pulse 72  Temp(Src) 98.3 F (36.8 C) (Oral)  Resp 18  Ht 5\' 9"  (1.753 m)  Wt 181 lb 7 oz (82.3 kg)  BMI 26.78 kg/m2  SpO2 94%  Physical Exam  Abdominal: Soft. Bowel sounds are normal.  Site of hepatic drain NT no bleeding Clean and dry Output 41 cc yesterday Serous fluid Cx: +microaerophilic strep   Skin: Skin is warm and dry.  Nursing note and vitals reviewed.   Imaging: Ct Abdomen Pelvis W Contrast  01/07/2015  CLINICAL DATA:  Fever of unknown origin. EXAM: CT ABDOMEN AND PELVIS WITH CONTRAST TECHNIQUE: Multidetector CT imaging of the abdomen and pelvis was performed using the standard protocol following bolus  administration of intravenous contrast. CONTRAST:  100mL OMNIPAQUE IOHEXOL 300 MG/ML  SOLN COMPARISON:  None. FINDINGS: Lower chest:  Normal. Hepatobiliary: Complex multi septated cystic lesion in the posterior medial aspect of the right lobe of the liver measuring 7.7 x 7.6 x 5.1 cm, most likely a hepatic abscess. Pancreas: Normal. Spleen: Normal. Adrenals/Urinary Tract: Normal adrenal glands. Normal kidneys and ureters. 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 There is a small air-containing 12 mm abscess adjacent to the abscess in the bladder wall. The fistula to the sigmoid portion of the colon is best seen on image 54 of series 6 and image 40 of series 5. This is immediately posterior to the anterior abdominal wall. Stomach/Bowel: Numerous diverticula in the distal colon with a focal fistula to the wall of the dome of the bladder. Terminal ileum and appendix are normal. Vascular/Lymphatic: No significant adenopathy. Aortic and iliac atherosclerosis. Reproductive: Normal. Other: No free air or free fluid. Musculoskeletal: No acute osseous abnormality. Large benign bone island in the head of the left femur with a smaller bone island in the left greater trochanter. Old Schmorl's node in the superior endplate of L2. IMPRESSION: 1. Complex cystic mass in the posterior medial aspect of the right lobe of the liver, most likely a liver abscess. 2.  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 Tiny adjacent 12 mm abscess containing air. Extensive adjacent sigmoid diverticulosis. Electronically Signed   By: Francene Boyers M.D.   On: 01/07/2015 15:00   Dg Chest Port 1 View  01/07/2015  CLINICAL DATA:  54 year old male with sepsis and fever for 2 weeks. EXAM: PORTABLE CHEST 1 VIEW COMPARISON:  12/28/2014  chest radiograph FINDINGS: The cardiomediastinal silhouette is unremarkable. There is no evidence of focal airspace disease, pulmonary edema, suspicious pulmonary nodule/mass, pleural effusion, or pneumothorax. No acute bony abnormalities are identified. Remote bilateral rib fractures are identified. IMPRESSION: No active disease. Electronically Signed   By: Harmon Pier M.D.   On: 01/07/2015 18:03   Ct Image Guided Drainage By Percutaneous Catheter  01/08/2015  CLINICAL DATA:  Hepatic abscess in the posterior right lobe requiring percutaneous drainage. EXAM: CT GUIDED DRAINAGE OF HEPATIC ABSCESS ANESTHESIA/SEDATION: 2.5 Mg IV Versed 175 mcg IV Fentanyl Total Moderate Sedation Time:  48 minutes PROCEDURE: The procedure, risks, benefits, and alternatives were explained to the patient. Questions regarding the procedure were encouraged and answered. The patient understands and consents to the procedure. A time-out was performed prior to the procedure. The right post a lateral abdominal wall was prepped with Betadine in a sterile fashion, and a sterile drape was applied covering the operative field. A sterile gown and sterile gloves were used for the procedure. Local anesthesia was provided with 1% Lidocaine. The patient was initially placed in a prone position. The right side was rolled up slightly and CT localization performed of the liver. Under CT guidance, an 18 gauge trocar needle was advanced to the level of a posterior right hepatic abscess. After confirming needle tip position, a fluid sample was withdrawn. A guidewire was then advanced. The percutaneous tract was dilated over the wire and a 12 French percutaneous drainage catheter advanced. Catheter positioning was confirmed by CT. The catheter was flushed and connected to suction bulb drainage. The catheter was secured at the skin with a Prolene retention suture and StatLock device. COMPLICATIONS: None FINDINGS: Aspiration at the level of a dominant  loculation of the posterior right hepatic abscess yielded grossly purulent fluid. After placement of the drainage catheter, there is good return of fluid. IMPRESSION: CT-guided percutaneous drainage performed of posterior right hepatic abscess with placement of a 12 French drain. The drain was connected to suction bulb drainage. A sample of purulent fluid was sent for culture analysis. Electronically Signed   By: Irish Lack M.D.   On: 01/08/2015 17:32    Labs:  CBC:  Recent Labs  01/08/15 0320 01/09/15 0252 01/10/15 0323 01/11/15 0322  WBC 13.7* 13.3* 10.5 9.4  HGB 10.4* 9.9* 10.2* 9.9*  HCT 31.7* 29.1* 30.0* 30.5*  PLT 269 249 295 346    COAGS:  Recent Labs  01/07/15 1816 01/07/15 2245 01/08/15 1200 01/09/15 0252  INR  --  1.16 1.25 1.26  APTT 39*  --   --   --     BMP:  Recent Labs  01/08/15 0320 01/09/15 0252 01/10/15 0323 01/11/15 0322  NA 134* 134* 136 134*  K 3.9 3.7 3.9 4.0  CL 101 101 102 99*  CO2 GLUCOSE 127* 148* 133* 127*  BUN CALCIUM 8.4* 8.3* 8.5* 8.4*  CREATININE 1.08 0.90 0.98 1.03  GFRNONAA >60 >60 >60 >60  GFRAA >60 >60 >60 >60  LIVER FUNCTION TESTS:  Recent Labs  01/08/15 0320 01/09/15 0252 01/10/15 0323 01/11/15 0322  BILITOT 0.9 0.7 0.5 0.6  AST 42* 63* 46* 36  ALT 67* 73* 60 53  ALKPHOS 132* 108 110 134*  PROT 5.6* 5.6* 5.2* 5.2*  ALBUMIN 2.4* 2.2* 2.2* 2.2*    Assessment and Plan:  Hepatic abscess Still significant output Re Ct when output 10cc/24 hrs Can follow in IR drain clinic as OP if needed  Signed: Annaliesa Blann A 01/11/2015, 8:47 AM   I spent a total of 15 Minutes at the the patient's bedside AND on the patient's hospital floor or unit, greater than 50% of which was counseling/coordinating care for hepatic abscess

## 2015-01-11 NOTE — Progress Notes (Signed)
Patient ID: Bobby Roth, male   DOB: 07/16/1960, 54 y.o.   MRN: [id number]    Subjective: Pt feels well.  No fevers in over 24 hours.  Tolerating a regular diet.  Objective: Vital signs in last 24 hours: Temp:  [97.9 F (36.6 C)-99 F (37.2 C)] 98.3 F (36.8 C) (10/16 0629) Pulse Rate:  [67-72] 72 (10/16 0629) Resp:  [18-26] 18 (10/16 0629) BP: (100-105)/(57-76) 100/69 mmHg (10/16 0629) SpO2:  [94 %-99 %] 94 % (10/16 0629) Last BM Date: 01/06/15  Intake/Output from previous day: 10/15 0701 - 10/16 0700 In: 960 [P.O.:440; IV Piggyback:500] Out: 41 [Drains:41] Intake/Output this shift:    PE: Abd: soft, essentially nontender, ND, drain with clear serous fluid currently, +BS Heart: regular Lungs: CTAB  Lab Results:   Recent Labs  01/10/15 0323 01/11/15 0322  WBC 10.5 9.4  HGB 10.2* 9.9*  HCT 30.0* 30.5*  PLT 295 346   BMET  Recent Labs  01/10/15 0323 01/11/15 0322  NA 136 134*  K 3.9 4.0  CL 102 99*  CO2 26 28  GLUCOSE 133* 127*  BUN 6 8  CREATININE 0.98 1.03  CALCIUM 8.5* 8.4*   PT/INR  Recent Labs  01/08/15 1200 01/09/15 0252  LABPROT 15.8* 16.0*  INR 1.25 1.26   CMP     Component Value Date/Time   NA 134* 01/11/2015 0322   K 4.0 01/11/2015 0322   CL 99* 01/11/2015 0322   CO2 28 01/11/2015 0322   GLUCOSE 127* 01/11/2015 0322   BUN 8 01/11/2015 0322   CREATININE 1.03 01/11/2015 0322   CALCIUM 8.4* 01/11/2015 0322   PROT 5.2* 01/11/2015 0322   ALBUMIN 2.2* 01/11/2015 0322   AST 36 01/11/2015 0322   ALT 53 01/11/2015 0322   ALKPHOS 134* 01/11/2015 0322   BILITOT 0.6 01/11/2015 0322   GFRNONAA >60 01/11/2015 0322   GFRAA >60 01/11/2015 0322   Lipase     Component Value Date/Time   LIPASE 20* 01/07/2015 1200       Studies/Results: No results found.  Anti-infectives: Anti-infectives    Start     Dose/Rate Route Frequency Ordered Stop   01/08/15 0200  vancomycin (VANCOCIN) IVPB 1000 mg/200 mL premix     1,000 mg 200 mL/hr  over 60 Minutes Intravenous Every 8 hours 01/07/15 1738     01/08/15 0000  piperacillin-tazobactam (ZOSYN) IVPB 3.375 g     3.375 g 12.5 mL/hr over 240 Minutes Intravenous Every 8 hours 01/07/15 1738     01/07/15 1745  vancomycin (VANCOCIN) 2,000 mg in sodium chloride 0.9 % 500 mL IVPB     2,000 mg 250 mL/hr over 120 Minutes Intravenous  Once 01/07/15 1733 01/07/15 2123   01/07/15 1730  metroNIDAZOLE (FLAGYL) IVPB 500 mg  Status:  Discontinued     500 mg 100 mL/hr over 60 Minutes Intravenous Every 8 hours 01/07/15 1729 01/10/15 0806   01/07/15 1730  piperacillin-tazobactam (ZOSYN) IVPB 3.375 g  Status:  Discontinued     3.375 g 100 mL/hr over 30 Minutes Intravenous  Once 01/07/15 1724 01/07/15 1725   01/07/15 1530  piperacillin-tazobactam (ZOSYN) IVPB 3.375 g     3.375 g 12.5 mL/hr over 240 Minutes Intravenous  Once 01/07/15 1522 01/07/15 1717       Assessment/Plan  1. Liver abscess, likely secondary to diverticular disease -cx grew microaerophilic strep -cont drain.  Will need outpatient follow up with IR drain clinic for repeat CT scan -Dr. Michaell CowingGross prefers 3 full days  of IV abx therapy after drain placed.  Patient is otherwise tolerating a solid diet and looks great.  He could likely be DC home hopefully tomorrow. -complete a total of 14 days of abx therapy.  Dr. Michaell Cowing recommends augmentin at discharge 2. Possible colovesical fistula -he does not seem to have this clinically, but CT suggestive   Elective colectomy / CV fistula repair 6-weeks after liver abscess healed. Dr. Michaell Cowing can see at [facility] office at d/c  LOS: 4 days    Nan Maya E 01/11/2015, 10:20 AM Pager: [phone]

## 2015-01-12 ENCOUNTER — Other Ambulatory Visit: Payer: Self-pay | Admitting: Radiology

## 2015-01-12 DIAGNOSIS — K5720 Diverticulitis of large intestine with perforation and abscess without bleeding: Secondary | ICD-10-CM

## 2015-01-12 DIAGNOSIS — B954 Other streptococcus as the cause of diseases classified elsewhere: Secondary | ICD-10-CM

## 2015-01-12 DIAGNOSIS — N321 Vesicointestinal fistula: Secondary | ICD-10-CM

## 2015-01-12 DIAGNOSIS — A408 Other streptococcal sepsis: Secondary | ICD-10-CM

## 2015-01-12 DIAGNOSIS — K750 Abscess of liver: Secondary | ICD-10-CM

## 2015-01-12 LAB — CULTURE, BLOOD (ROUTINE X 2)
CULTURE: NO GROWTH
Culture: NO GROWTH

## 2015-01-12 LAB — CULTURE, ROUTINE-ABSCESS

## 2015-01-12 MED ORDER — AMOXICILLIN-POT CLAVULANATE 875-125 MG PO TABS: 1.0000 | ORAL_TABLET | Freq: Two times a day (BID) | ORAL | Status: DC

## 2015-01-12 NOTE — Progress Notes (Signed)
Order placed for IR drain clinic follow-up and CT next week. Our office will contact the patient to arrange time. Continue to flush drain BID with 5 ml sterile saline and monitor daily output.   Pattricia BossKoreen Irie Fiorello PA-C Interventional Radiology  01/12/15  10:30 AM

## 2015-01-12 NOTE — Progress Notes (Signed)
Utilization review completed.  

## 2015-01-12 NOTE — Progress Notes (Signed)
Patient ID: Bobby Roth, male   DOB: 06/10/1960, 54 y.o.   MRN: [id number]    Subjective: Pt feels well.  Ready to go home.  Objective: Vital signs in last 24 hours: Temp:  [98.2 F (36.8 C)-98.7 F (37.1 C)] 98.2 F (36.8 C) (10/17 0520) Pulse Rate:  [62-77] 62 (10/17 0520) Resp:  [18] 18 (10/17 0520) BP: (102-112)/(51-68) 108/68 mmHg (10/17 0520) SpO2:  [94 %-99 %] 95 % (10/17 0520) Last BM Date: 01/11/15  Intake/Output from previous day: 10/16 0701 - 10/17 0700 In: 940 [P.O.:360; IV Piggyback:550] Out: 17 [Drains:17] Intake/Output this shift:    PE: Abd: soft, jp drain with minimal serous drainage, +BS, ND  Lab Results:   Recent Labs  01/10/15 0323 01/11/15 0322  WBC 10.5 9.4  HGB 10.2* 9.9*  HCT 30.0* 30.5*  PLT 295 346   BMET  Recent Labs  01/10/15 0323 01/11/15 0322  NA 136 134*  K 3.9 4.0  CL 102 99*  CO2 26 28  GLUCOSE 133* 127*  BUN 6 8  CREATININE 0.98 1.03  CALCIUM 8.5* 8.4*   PT/INR No results for input(s): LABPROT, INR in the last 72 hours. CMP     Component Value Date/Time   NA 134* 01/11/2015 0322   K 4.0 01/11/2015 0322   CL 99* 01/11/2015 0322   CO2 28 01/11/2015 0322   GLUCOSE 127* 01/11/2015 0322   BUN 8 01/11/2015 0322   CREATININE 1.03 01/11/2015 0322   CALCIUM 8.4* 01/11/2015 0322   PROT 5.2* 01/11/2015 0322   ALBUMIN 2.2* 01/11/2015 0322   AST 36 01/11/2015 0322   ALT 53 01/11/2015 0322   ALKPHOS 134* 01/11/2015 0322   BILITOT 0.6 01/11/2015 0322   GFRNONAA >60 01/11/2015 0322   GFRAA >60 01/11/2015 0322   Lipase     Component Value Date/Time   LIPASE 20* 01/07/2015 1200       Studies/Results: No results found.  Anti-infectives: Anti-infectives    Start     Dose/Rate Route Frequency Ordered Stop   01/08/15 0200  vancomycin (VANCOCIN) IVPB 1000 mg/200 mL premix     1,000 mg 200 mL/hr over 60 Minutes Intravenous Every 8 hours 01/07/15 1738     01/08/15 0000  piperacillin-tazobactam (ZOSYN) IVPB 3.375  g     3.375 g 12.5 mL/hr over 240 Minutes Intravenous Every 8 hours 01/07/15 1738     01/07/15 1745  vancomycin (VANCOCIN) 2,000 mg in sodium chloride 0.9 % 500 mL IVPB     2,000 mg 250 mL/hr over 120 Minutes Intravenous  Once 01/07/15 1733 01/07/15 2123   01/07/15 1730  metroNIDAZOLE (FLAGYL) IVPB 500 mg  Status:  Discontinued     500 mg 100 mL/hr over 60 Minutes Intravenous Every 8 hours 01/07/15 1729 01/10/15 0806   01/07/15 1730  piperacillin-tazobactam (ZOSYN) IVPB 3.375 g  Status:  Discontinued     3.375 g 100 mL/hr over 30 Minutes Intravenous  Once 01/07/15 1724 01/07/15 1725   01/07/15 1530  piperacillin-tazobactam (ZOSYN) IVPB 3.375 g     3.375 g 12.5 mL/hr over 240 Minutes Intravenous  Once 01/07/15 1522 01/07/15 1717       Assessment/Plan 1. Liver abscess, likely secondary to diverticular disease -cx grew microaerophilic strep -cont drain. Will need outpatient follow up with IR drain clinic for repeat CT scan -ok for dc today from our standpoint. -our office is arranging follow up 2. Possible colovesical fistula -he does not seem to have this clinically, but CT suggestive     -  he will likely need elective resection in about 6 weeks s/p DC and after liver abscess resolved.  He just had c-scope 2 years ago at [facility].  This revealed some diverticulosis, but nothing else of significance.  LOS: 5 days    Jennifr Gaeta E 01/12/2015, 11:44 AM Pager: [phone]

## 2015-01-12 NOTE — Discharge Summary (Signed)
Physician Discharge Summary  Bobby Roth ZOX:[id number] DOB: [birth year] DOA: 01/07/2015  PCP: Bobby Palau, FNP  Admit date: 01/07/2015 Discharge date: 01/12/2015  Time spent: 40 minutes  Recommendations for Outpatient Follow-up:  1. Follow-up with Dr. Maisie Fus of [facility] surgery in 3 weeks. 2. Follow-up with [facility] radiology, Dr. Fredia Sorrow, office will call you.  Discharge Diagnoses:  Active Problems:   Diverticulitis   Colonic diverticular abscess   Liver abscess   Sepsis (HCC)   Alcoholic cirrhosis of liver without ascites (HCC)   Alcoholic hepatitis without ascites   Alcohol abuse   Diverticulitis of large intestine with abscess without bleeding   Colo-vesical fistula   Discharge Condition: Stable  Diet recommendation:  heart healthy  Filed Weights   01/07/15 1714 01/07/15 2137  Weight: 82.101 kg (181 lb) 82.3 kg (181 lb 7 oz)    History of present illness:  Bobby Roth is a 54 y.o. male  Presenting with fever. The patient states that he has had dysuria since May. Since then the head is been treated with antibiotics for dysuria but patient reports that since that time he has continued to have dysuria. Patient had multiple visits to the ED secondary to fevers. Workup did not show source as such patient's primary care physician recommended ED evaluation for a more thorough evaluation which was to include CT scan of abdomen and pelvis.  CT scan results reports intramural abscess in the wall of the dome of the bladder with a fistula to the sigmoid colon. There is also most likely a liver abscess. Gen. surgery contacted and recommended admission and into medical service.  Bobby Roth daughter Cicero Duck works in the [employer] [facility], she is a Teacher, music.  Hospital Course:   Sepsis -Secondary to multiple intra-abdominal abscesses, continue supportive therapy and antibiotics.  -Drainage of the liver abscess showed abundant  microaerophilic streptococci. -Currently on Zosyn and vancomycin. -Consulted infectious disease and recommended Augmentin for 60 days.   Colonic diverticular abscess w/ colovesical fistula -Radiographic evidence of colovesical fistula without having clinical symptoms. -No pneumaturia, smelly urine or even the urinalysis did not show that much of bacteria. -Per general surgery patient will need sigmoid colectomy after the liver infection resolves.  Intramural bladder wall abscess -Seen on CT of abdomen pelvis showed intramural abscess in the wall of the dome of the bladder. -Special consideration should be paid to that on next CT scan if still there might need urological consultation.   posterior multiloculated Liver abscess -10/13 IR drainage - Gen Surg suggests repeat CT abdom 10/18-1, grew microaerophilic streptococci. -Discharged on Augmentin, patient to follow-up with interventional radiology for follow-up CT. -To follow-up also for the timing of removal of the percutaneous drain.   Elevated LFTs -Acute hepatitis panel negative - INR normal - HIV negative - LFTs have essentially normalized . -This is likely secondary to the liver abscess.  Alcohol abuse -Drinks about 6 ounces of scotch per day, counseled about his alcohol use.   Procedures:  Placement of 12 French percutaneous drain for liver abscess placed by IR on 01/08/2015  Consultations:  Interventional radiology.  General surgery.  Fractures disease.  Discharge Exam: Filed Vitals:   01/12/15 0520  BP: 108/68  Pulse: 62  Temp: 98.2 F (36.8 C)  Resp: 18   General: Alert and awake, oriented x3, not in any acute distress. HEENT: anicteric sclera, pupils reactive to light and accommodation, EOMI CVS: S1-S2 clear, no murmur rubs or gallops Chest: clear to auscultation  bilaterally, no wheezing, rales or rhonchi Abdomen: soft nontender, nondistended, normal bowel sounds, no organomegaly Extremities: no cyanosis,  clubbing or edema noted bilaterally Neuro: Cranial nerves II-XII intact, no focal neurological deficits  Discharge Instructions   Discharge Instructions    Diet - low sodium heart healthy    Complete by:  As directed      Increase activity slowly    Complete by:  As directed           Current Discharge Medication List    START taking these medications   Details  amoxicillin-clavulanate (AUGMENTIN) 875-125 MG tablet Take 1 tablet by mouth every 12 (twelve) hours. Qty: 120 tablet, Refills: 0      CONTINUE these medications which have NOT CHANGED   Details  acetaminophen (TYLENOL) 500 MG tablet Take 500 mg by mouth every 6 (six) hours as needed for fever.    aspirin EC 81 MG tablet Take 81 mg by mouth daily.    atorvastatin (LIPITOR) 20 MG tablet Take 20 mg by mouth at bedtime. Refills: 2    Cholecalciferol (VITAMIN D) 2000 UNITS tablet Take 2,000 Units by mouth daily.    ibuprofen (ADVIL,MOTRIN) 200 MG tablet Take 400-600 mg by mouth 2 (two) times daily as needed for fever (pain).    omeprazole (PRILOSEC) 20 MG capsule Take 20 mg by mouth daily. Refills: 0    PARoxetine (PAXIL) 20 MG tablet Take 20 mg by mouth daily. Refills: 0    tamsulosin (FLOMAX) 0.4 MG CAPS capsule Take 0.4 mg by mouth at bedtime. Refills: 9       No Known Allergies Follow-up Information    Follow up with Vanita PandaHOMAS, ALICIA C., MD. Schedule an appointment as soon as possible for a visit in 3 weeks.   Specialty:  General Surgery   Why:  to discuss elective surgery for your colon   Contact information:   [address] [place] [address] [phone]       Follow up with [facility].   Why:  Someone from [facility] will contact you concerning start date and time for Home Health RN.    Contact information:   [address] [place] [address] [phone]       Follow up with Reola CalkinsYAMAGATA,GLENN T, MD.   Specialty:  Interventional Radiology   Why:   their office will call you   Contact information:   [address] [place] [address] [phone]        The results of significant diagnostics from this hospitalization (including imaging, microbiology, ancillary and laboratory) are listed below for reference.    Significant Diagnostic Studies: Dg Chest 2 View  12/28/2014  CLINICAL DATA:  Fever and shortness of breath for 2 days.  Smoker. EXAM: CHEST  2 VIEW COMPARISON:  None. FINDINGS: Lower thoracic spondylosis. Midline trachea. Normal heart size and mediastinal contours. No pleural effusion or pneumothorax. Diffuse peribronchial thickening. No lobar consolidation. IMPRESSION: 1.  No acute cardiopulmonary disease. 2. Mild peribronchial thickening which may relate to chronic bronchitis or smoking. Electronically Signed   By: Jeronimo GreavesKyle  Talbot M.D.   On: 12/28/2014 18:45   Ct Abdomen Pelvis W Contrast  01/07/2015  CLINICAL DATA:  Fever of unknown origin. EXAM: CT ABDOMEN AND PELVIS WITH CONTRAST TECHNIQUE: Multidetector CT imaging of the abdomen and pelvis was performed using the standard protocol following bolus administration of intravenous contrast. CONTRAST:  100mL OMNIPAQUE IOHEXOL 300 MG/ML  SOLN COMPARISON:  None. FINDINGS: Lower chest:  Normal. Hepatobiliary: Complex multi septated cystic lesion in the posterior medial aspect of the right lobe of the liver measuring 7.7 x 7.6 x 5.1 cm, most likely a hepatic abscess. Pancreas: Normal. Spleen: Normal. Adrenals/Urinary Tract: Normal adrenal glands. Normal kidneys and ureters. 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 There is a small air-containing 12 mm abscess adjacent to the abscess in the bladder wall. The fistula to the sigmoid portion of the colon is best seen on image 54 of series 6 and image 40 of series 5.  This is immediately posterior to the anterior abdominal wall. Stomach/Bowel: Numerous diverticula in the distal colon with a focal fistula to the wall of the dome of the bladder. Terminal ileum and appendix are normal. Vascular/Lymphatic: No significant adenopathy. Aortic and iliac atherosclerosis. Reproductive: Normal. Other: No free air or free fluid. Musculoskeletal: No acute osseous abnormality. Large benign bone island in the head of the left femur with a smaller bone island in the left greater trochanter. Old Schmorl's node in the superior endplate of L2. IMPRESSION: 1. Complex cystic mass in the posterior medial aspect of the right lobe of the liver, most likely a liver abscess. 2. 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 Tiny adjacent 12 mm abscess containing air. Extensive adjacent sigmoid diverticulosis. Electronically Signed   By: Francene Boyers M.D.   On: 01/07/2015 15:00   Dg Chest Port 1 View  01/07/2015  CLINICAL DATA:  54 year old male with sepsis and fever for 2 weeks. EXAM: PORTABLE CHEST 1 VIEW COMPARISON:  12/28/2014 chest radiograph FINDINGS: The cardiomediastinal silhouette is unremarkable. There is no evidence of focal airspace disease, pulmonary edema, suspicious pulmonary nodule/mass, pleural effusion, or pneumothorax. No acute bony abnormalities are identified. Remote bilateral rib fractures are identified. IMPRESSION: No active disease. Electronically Signed   By: Harmon Pier M.D.   On: 01/07/2015 18:03   Ct Image Guided Drainage By Percutaneous Catheter  01/08/2015  CLINICAL DATA:  Hepatic abscess in the posterior right lobe requiring percutaneous drainage. EXAM: CT GUIDED DRAINAGE OF HEPATIC ABSCESS ANESTHESIA/SEDATION: 2.5 Mg IV Versed 175 mcg IV Fentanyl Total Moderate Sedation Time:  48 minutes PROCEDURE: The procedure, risks, benefits, and alternatives were explained to the patient. Questions regarding the procedure were  encouraged and answered. The patient understands and consents to the procedure. A time-out was performed prior to the procedure. The right post a lateral abdominal wall was prepped with Betadine in a sterile fashion, and a sterile drape was applied covering the operative field. A sterile gown and sterile gloves were used for the procedure. Local anesthesia was provided with 1% Lidocaine. The patient was initially placed in a prone position. The right side was rolled up slightly and CT localization performed of the liver. Under CT guidance, an 18 gauge trocar needle was advanced to the level of a posterior right hepatic abscess. After confirming needle tip position, a fluid sample was withdrawn. A guidewire was then advanced. The percutaneous tract was dilated over the wire and a 12 French percutaneous drainage catheter advanced. Catheter positioning was confirmed by CT. The catheter was flushed and connected to suction bulb drainage. The catheter was secured at the skin with a Prolene retention suture and StatLock device. COMPLICATIONS:  None FINDINGS: Aspiration at the level of a dominant loculation of the posterior right hepatic abscess yielded grossly purulent fluid. After placement of the drainage catheter, there is good return of fluid. IMPRESSION: CT-guided percutaneous drainage performed of posterior right hepatic abscess with placement of a 12 French drain. The drain was connected to suction bulb drainage. A sample of purulent fluid was sent for culture analysis. Electronically Signed   By: Irish Lack M.D.   On: 01/08/2015 17:32    Microbiology: Recent Results (from the past 240 hour(s))  Culture, blood (x 2)     Status: None (Preliminary result)   Collection Time: 01/07/15  5:41 PM  Result Value Ref Range Status   Specimen Description BLOOD LEFT ARM  Final   Special Requests BOTTLES DRAWN AEROBIC AND ANAEROBIC 5CC  Final   Culture NO GROWTH 4 DAYS  Final   Report Status PENDING  Incomplete   Culture, blood (x 2)     Status: None (Preliminary result)   Collection Time: 01/07/15  5:45 PM  Result Value Ref Range Status   Specimen Description BLOOD LEFT HAND  Final   Special Requests BOTTLES DRAWN AEROBIC AND ANAEROBIC 5CC  Final   Culture NO GROWTH 4 DAYS  Final   Report Status PENDING  Incomplete  MRSA PCR Screening     Status: None   Collection Time: 01/07/15 10:32 PM  Result Value Ref Range Status   MRSA by PCR NEGATIVE NEGATIVE Final    Comment:        The GeneXpert MRSA Assay (FDA approved for NASAL specimens only), is one component of a comprehensive MRSA colonization surveillance program. It is not intended to diagnose MRSA infection nor to guide or monitor treatment for MRSA infections.   Culture, Urine     Status: None   Collection Time: 01/08/15 12:30 PM  Result Value Ref Range Status   Specimen Description URINE, CLEAN CATCH  Final   Special Requests NONE  Final   Culture NO GROWTH 1 DAY  Final   Report Status 01/09/2015 FINAL  Final  Culture, routine-abscess     Status: None   Collection Time: 01/08/15  4:00 PM  Result Value Ref Range Status   Specimen Description ABSCESS  Final   Special Requests ASPIRATE FROM HEPATIC ABSCESS  Final   Gram Stain   Final    ABUNDANT WBC PRESENT,BOTH PMN AND MONONUCLEAR NO SQUAMOUS EPITHELIAL CELLS SEEN FEW GRAM POSITIVE COCCI IN PAIRS Performed at [facility]    Culture   Final    ABUNDANT MICROAEROPHILIC STREPTOCOCCI Note: Standardized susceptibility testing for this organism is not available. Performed at [facility]    Report Status 01/12/2015 FINAL  Final  Anaerobic culture     Status: None (Preliminary result)   Collection Time: 01/08/15  4:00 PM  Result Value Ref Range Status   Specimen Description ABSCESS  Final   Special Requests  ASPIRATE FROM HEPATIC ABSCESS  Final   Gram Stain   Final    ABUNDANT WBC PRESENT,BOTH PMN AND MONONUCLEAR NO SQUAMOUS EPITHELIAL CELLS SEEN RARE GRAM  POSITIVE COCCI IN PAIRS Performed at [facility]    Culture   Final    NO ANAEROBES ISOLATED; CULTURE IN PROGRESS FOR 5 DAYS Performed at [facility]    Report Status PENDING  Incomplete     Labs: Basic Metabolic Panel:  Recent Labs Lab 01/07/15 1816 01/08/15 0320 01/09/15 0252 01/10/15 0323 01/11/15 0322  NA 133* 134* 134*  136 134*  K 4.3 3.9 3.7 3.9 4.0  CL 98* 101 101 102 99*  CO2 24 25 25 26 28   GLUCOSE 125* 127* 148* 133* 127*  BUN 11 9 7 6 8   CREATININE 1.08 1.08 0.90 0.98 1.03  CALCIUM 8.5* 8.4* 8.3* 8.5* 8.4*  MG 1.9  --   --   --   --   PHOS 3.4  --   --   --   --    Liver Function Tests:  Recent Labs Lab 01/07/15 1816 01/08/15 0320 01/09/15 0252 01/10/15 0323 01/11/15 0322  AST 51* 42* 63* 46* 36  ALT 77* 67* 73* 60 53  ALKPHOS 136* 132* 108 110 134*  BILITOT 0.9 0.9 0.7 0.5 0.6  PROT 6.0* 5.6* 5.6* 5.2* 5.2*  ALBUMIN 2.6* 2.4* 2.2* 2.2* 2.2*    Recent Labs Lab 01/07/15 1200  LIPASE 20*   No results for input(s): AMMONIA in the last 168 hours. CBC:  Recent Labs Lab 01/07/15 1200 01/07/15 1816 01/08/15 0320 01/09/15 0252 01/10/15 0323 01/11/15 0322  WBC 16.8* 15.8* 13.7* 13.3* 10.5 9.4  NEUTROABS 13.8* 12.6*  --  10.9*  --   --   HGB 11.5* 10.8* 10.4* 9.9* 10.2* 9.9*  HCT 34.2* 32.6* 31.7* 29.1* 30.0* 30.5*  MCV 91.9 92.6 92.4 92.1 92.6 92.4  PLT 281 264 269 249 295 346   Cardiac Enzymes: No results for input(s): CKTOTAL, CKMB, CKMBINDEX, TROPONINI in the last 168 hours. BNP: BNP (last 3 results) No results for input(s): BNP in the last 8760 hours.  ProBNP (last 3 results) No results for input(s): PROBNP in the last 8760 hours.  CBG: No results for input(s): GLUCAP in the last 168 hours.     Signed:  Precious Segall A  [facility] 01/12/2015, 1:28 PM

## 2015-01-12 NOTE — Care Management Note (Signed)
Case Management Note  Patient Details  Name: Bobby Roth MRN: [id number] Date of Birth: 10/26/1960  Subjective/Objective:   54 yr old male admitted with diverticulitis, s/p placement of abscess drain.    Action/Plan:  Case manager spoke with patient and wife concerning home health needs at discharge. Choice was offered. [facility] for [facility]. Patient being discharged with abcess drain. Wife will assist at discharge.  Expected Discharge Date:   01/12/15               Expected Discharge Plan:   Home with Home Health RN  In-House Referral:  NA  Discharge planning Services  CM Consult  Post Acute Care Choice:  Home Health Choice offered to:  Patient, Spouse  DME Arranged:  N/A DME Agency:  NA  HH Arranged:  RN HH Agency:  [facility]  Status of Service:  Completed, signed off  Medicare Important Message Given:    Date Medicare IM Given:    Medicare IM give by:    Date Additional Medicare IM Given:    Additional Medicare Important Message give by:     If discussed at Long Length of Stay Meetings, dates discussed:    Additional Comments:  Durenda GuthrieBrady, Bobby Stuard Naomi, RN 01/12/2015, 11:43 AM

## 2015-01-12 NOTE — Consult Note (Signed)
[facility]    Date of Admission:  01/07/2015  Date of Consult:  01/12/2015  Reason for Consult: Liver abscess, diverticular abscess and possible colo-vesicular fistula  Referring Physician: Dr. Hartford Poli   HPI: Bobby Roth is an 54 y.o. male with prior history of diverticulitis with admission with right flank pain, fevers initially thought to have prostatitis sp doxycycine, with worsening fevers decline seen in ED rx for flu, sp rocephin and then cipro came to ED where finally found to have abscess in the abdomen concerning on imaging for being within the bladder with colo-vesicular fistula along with liver abscess. He was started on broad spectrum abx and had IR aspirate of liver abscess which has now grown micro-aerophilic streptococci. He feels much better and is anxious to go home.   Past Medical History  Diagnosis Date  . Hypercholesterolemia     Past Surgical History  Procedure Laterality Date  . Tonsillectomy    . Pilonidal cyst excision      Social History:  reports that he has been smoking Cigarettes.  He has been smoking about 1.00 pack per day. He does not have any smokeless tobacco history on file. He reports that he drinks about 33.6 oz of alcohol per week. He reports that he does not use illicit drugs.   History reviewed. No pertinent family history.   Daughter healthy  No Known Allergies   Medications: I have reviewed patients current medications as documented in Epic Anti-infectives    Start     Dose/Rate Route Frequency Ordered Stop   01/12/15 1330  amoxicillin-clavulanate (AUGMENTIN) 875-125 MG per tablet 1 tablet     1 tablet Oral Every 12 hours 01/12/15 1316 03/13/15 0959   01/08/15 0200  vancomycin (VANCOCIN) IVPB 1000 mg/200 mL premix  Status:  Discontinued     1,000 mg 200 mL/hr over 60 Minutes Intravenous Every 8 hours 01/07/15 1738 01/12/15 1246   01/08/15 0000  piperacillin-tazobactam (ZOSYN)  IVPB 3.375 g  Status:  Discontinued     3.375 g 12.5 mL/hr over 240 Minutes Intravenous Every 8 hours 01/07/15 1738 01/12/15 1316   01/07/15 1745  vancomycin (VANCOCIN) 2,000 mg in sodium chloride 0.9 % 500 mL IVPB     2,000 mg 250 mL/hr over 120 Minutes Intravenous  Once 01/07/15 1733 01/07/15 2123   01/07/15 1730  metroNIDAZOLE (FLAGYL) IVPB 500 mg  Status:  Discontinued     500 mg 100 mL/hr over 60 Minutes Intravenous Every 8 hours 01/07/15 1729 01/10/15 0806   01/07/15 1730  piperacillin-tazobactam (ZOSYN) IVPB 3.375 g  Status:  Discontinued     3.375 g 100 mL/hr over 30 Minutes Intravenous  Once 01/07/15 1724 01/07/15 1725   01/07/15 1530  piperacillin-tazobactam (ZOSYN) IVPB 3.375 g     3.375 g 12.5 mL/hr over 240 Minutes Intravenous  Once 01/07/15 1522 01/07/15 1717         ROS: as per HPI + for fevers, chills, fatigue, abdominal pain, flank pain, otherwise negative on 12 point review.  Blood pressure 108/68, pulse 62, temperature 98.2 F (36.8 C), temperature source Oral, resp. rate 18, height 5' 9"  (1.753 m), weight 181 lb 7 oz (82.3 kg), SpO2 95 %.   General: Alert and awake, oriented x3, not in any acute distress. HEENT: anicteric sclera,  EOMI, oropharynx clear and without exudate Cardiovascular: regular rate, normal r,   Pulmonary:  no wheezing, resp distress Gastrointestinal:  soft nondistended, normal bowel sounds, drain in place Musculoskeletal: no  clubbing or edema noted bilaterally Skin, soft tissue: no rashes Neuro: nonfocal   Results for orders placed or performed during the hospital encounter of 01/07/15 (from the past 48 hour(s))  Comprehensive metabolic panel     Status: Abnormal   Collection Time: 01/11/15  3:22 AM  Result Value Ref Range   Sodium 134 (L) 135 - 145 mmol/L   Potassium 4.0 3.5 - 5.1 mmol/L   Chloride 99 (L) 101 - 111 mmol/L   CO2 28 22 - 32 mmol/L   Glucose, Bld 127 (H) 65 - 99 mg/dL   BUN 8 6 - 20 mg/dL   Creatinine, Ser 1.03 0.61  - 1.24 mg/dL   Calcium 8.4 (L) 8.9 - 10.3 mg/dL   Total Protein 5.2 (L) 6.5 - 8.1 g/dL   Albumin 2.2 (L) 3.5 - 5.0 g/dL   AST 36 15 - 41 U/L   ALT 53 17 - 63 U/L   Alkaline Phosphatase 134 (H) 38 - 126 U/L   Total Bilirubin 0.6 0.3 - 1.2 mg/dL   GFR calc non Af Amer >60 >60 mL/min   GFR calc Af Amer >60 >60 mL/min    Comment: (NOTE) The eGFR has been calculated using the CKD EPI equation. This calculation has not been validated in all clinical situations. eGFR's persistently <60 mL/min signify possible Chronic Kidney Disease.    Anion gap 7 5 - 15  CBC     Status: Abnormal   Collection Time: 01/11/15  3:22 AM  Result Value Ref Range   WBC 9.4 4.0 - 10.5 K/uL   RBC 3.30 (L) 4.22 - 5.81 MIL/uL   Hemoglobin 9.9 (L) 13.0 - 17.0 g/dL   HCT 30.5 (L) 39.0 - 52.0 %   MCV 92.4 78.0 - 100.0 fL   MCH 30.0 26.0 - 34.0 pg   MCHC 32.5 30.0 - 36.0 g/dL   RDW 14.6 11.5 - 15.5 %   Platelets 346 150 - 400 K/uL   @BRIEFLABTABLE (sdes,specrequest,cult,reptstatus)   ) Recent Results (from the past 720 hour(s))  Urine culture     Status: None   Collection Time: 12/28/14  5:13 PM  Result Value Ref Range Status   Specimen Description URINE, CLEAN CATCH  Final   Special Requests NONE  Final   Culture NO GROWTH 1 DAY  Final   Report Status 12/29/2014 FINAL  Final  Culture, blood (routine x 2)     Status: None   Collection Time: 12/28/14  5:39 PM  Result Value Ref Range Status   Specimen Description BLOOD RIGHT FOREARM  Final   Special Requests BOTTLES DRAWN AEROBIC AND ANAEROBIC 5CC  Final   Culture NO GROWTH 5 DAYS  Final   Report Status 01/02/2015 FINAL  Final  Culture, blood (routine x 2)     Status: None   Collection Time: 12/28/14  5:40 PM  Result Value Ref Range Status   Specimen Description BLOOD RIGHT HAND  Final   Special Requests BOTTLES DRAWN AEROBIC AND ANAEROBIC 3ML  Final   Culture NO GROWTH 5 DAYS  Final   Report Status 01/02/2015 FINAL  Final  Culture, blood (x 2)      Status: None (Preliminary result)   Collection Time: 01/07/15  5:41 PM  Result Value Ref Range Status   Specimen Description BLOOD LEFT ARM  Final   Special Requests BOTTLES DRAWN AEROBIC AND ANAEROBIC 5CC  Final   Culture NO GROWTH 4  DAYS  Final   Report Status PENDING  Incomplete  Culture, blood (x 2)     Status: None (Preliminary result)   Collection Time: 01/07/15  5:45 PM  Result Value Ref Range Status   Specimen Description BLOOD LEFT HAND  Final   Special Requests BOTTLES DRAWN AEROBIC AND ANAEROBIC 5CC  Final   Culture NO GROWTH 4 DAYS  Final   Report Status PENDING  Incomplete  MRSA PCR Screening     Status: None   Collection Time: 01/07/15 10:32 PM  Result Value Ref Range Status   MRSA by PCR NEGATIVE NEGATIVE Final    Comment:        The GeneXpert MRSA Assay (FDA approved for NASAL specimens only), is one component of a comprehensive MRSA colonization surveillance program. It is not intended to diagnose MRSA infection nor to guide or monitor treatment for MRSA infections.   Culture, Urine     Status: None   Collection Time: 01/08/15 12:30 PM  Result Value Ref Range Status   Specimen Description URINE, CLEAN CATCH  Final   Special Requests NONE  Final   Culture NO GROWTH 1 DAY  Final   Report Status 01/09/2015 FINAL  Final  Culture, routine-abscess     Status: None   Collection Time: 01/08/15  4:00 PM  Result Value Ref Range Status   Specimen Description ABSCESS  Final   Special Requests ASPIRATE FROM HEPATIC ABSCESS  Final   Gram Stain   Final    ABUNDANT WBC PRESENT,BOTH PMN AND MONONUCLEAR NO SQUAMOUS EPITHELIAL CELLS SEEN FEW GRAM POSITIVE COCCI IN PAIRS Performed at [facility]    Culture   Final    ABUNDANT MICROAEROPHILIC STREPTOCOCCI Note: Standardized susceptibility testing for this organism is not available. Performed at [facility]    Report Status 01/12/2015 FINAL  Final  Anaerobic culture     Status: None (Preliminary  result)   Collection Time: 01/08/15  4:00 PM  Result Value Ref Range Status   Specimen Description ABSCESS  Final   Special Requests  ASPIRATE FROM HEPATIC ABSCESS  Final   Gram Stain   Final    ABUNDANT WBC PRESENT,BOTH PMN AND MONONUCLEAR NO SQUAMOUS EPITHELIAL CELLS SEEN RARE GRAM POSITIVE COCCI IN PAIRS Performed at [facility]    Culture   Final    NO ANAEROBES ISOLATED; CULTURE IN PROGRESS FOR 5 DAYS Performed at [facility]    Report Status PENDING  Incomplete     Impression/Recommendation  Active Problems:   Diverticulitis   Colonic diverticular abscess   Liver abscess   Sepsis ([place])   Alcoholic cirrhosis of liver without ascites ([place])   Alcoholic hepatitis without ascites   Alcohol abuse   Diverticulitis of large intestine with abscess without bleeding   Colo-vesical fistula   Bobby Roth is a 54 y.o. male with  Recurrent diverticular disease now with intra-bdominal abscess though not clear if within the bladder or bowel, ? Fistula and also liver abscess  I agree with protracted antibiotics given sizable liver abscess and intra-bdominal infection with possible fistula   I would send him out with one month of augmentin 875/125 bid  He will need close followup and certainly repeat imaging.  If he truly has abscess in bladder and or fistula I doubt he will be able to have a simple surgery to fix his diverticular disease but protracted abx will hopefull alow this process to "cool off" and help optimize his surgery  I will arrange for HSFU in our clinic      01/12/2015, 1:17 PM   Thank you so much for this interesting consult  [place] [facility] [place] (pager) [phone] (office) 01/12/2015, 1:17 PM  Rhina Brack[place] 01/12/2015, 1:17 PM

## 2015-01-12 NOTE — Progress Notes (Signed)
Bobby Roth to be D/C'd Home per MD order.  Discussed with the patient and all questions fully answered.  VSS. Drain dressing clean dry and intact. No output since last measurement. Patient and wife provided with information and handouts about drain care at home.   IV catheter discontinued intact. Site without signs and symptoms of complications. Dressing and pressure applied.  An After Visit Summary was printed and given to the patient. Patient received prescription.  D/c education completed with patient/family including follow up instructions, medication list, d/c activities limitations if indicated, with other d/c instructions as indicated by MD - patient able to verbalize understanding, all questions fully answered.   Patient instructed to return to ED, call 911, or call MD for any changes in condition.    L'ESPERANCE, Charod Slawinski C 01/12/2015 1:57 PM

## 2015-01-13 ENCOUNTER — Other Ambulatory Visit (HOSPITAL_COMMUNITY): Payer: Self-pay | Admitting: Interventional Radiology

## 2015-01-13 ENCOUNTER — Other Ambulatory Visit: Payer: Self-pay | Admitting: Surgery

## 2015-01-13 DIAGNOSIS — K750 Abscess of liver: Secondary | ICD-10-CM

## 2015-01-13 LAB — ANAEROBIC CULTURE

## 2015-01-22 ENCOUNTER — Other Ambulatory Visit: Payer: Self-pay | Admitting: Surgery

## 2015-01-22 ENCOUNTER — Ambulatory Visit
Admission: RE | Admit: 2015-01-22 | Discharge: 2015-01-22 | Disposition: A | Discharge status: Home / Self Care | Payer: BLUE CROSS/BLUE SHIELD | Source: Ambulatory Visit | Attending: Radiology | Admitting: Radiology

## 2015-01-22 ENCOUNTER — Ambulatory Visit
Admission: RE | Admit: 2015-01-22 | Discharge: 2015-01-22 | Disposition: A | Discharge status: Home / Self Care | Payer: BLUE CROSS/BLUE SHIELD | Source: Ambulatory Visit | Attending: Surgery | Admitting: Surgery

## 2015-01-22 ENCOUNTER — Other Ambulatory Visit (HOSPITAL_COMMUNITY): Payer: Self-pay | Admitting: Interventional Radiology

## 2015-01-22 ENCOUNTER — Encounter (INDEPENDENT_AMBULATORY_CARE_PROVIDER_SITE_OTHER): Payer: Self-pay

## 2015-01-22 DIAGNOSIS — K750 Abscess of liver: Secondary | ICD-10-CM

## 2015-01-22 MED ORDER — IOPAMIDOL (ISOVUE-300) INJECTION 61%
100.0000 mL | Freq: Once | INTRAVENOUS | Status: AC | PRN
  Administered 2015-01-22: 100 mL via INTRAVENOUS

## 2015-01-22 NOTE — Consult Note (Signed)
Chief Complaint: Patient was seen in consultation today for liver abscess postdrainage, at the request of Morgan,Koreen D  Referring Physician(s): Morgan,Koreen D  History of Present Illness: Bobby Roth is a 54 y.o. male status post percutaneous liver abscess drain catheter placement on 01/08/2015. He had initially presented with both liver abscess as well as a diverticular abscess adjacent to the dome of the bladder.. He's done well. His outputs been consistently 15-20 mL per day. He's flushing 3 times a day with 10 mL sterile saline and returning to bulb suction. He notices draining leaking from the skin entry site with flushes more recently. He still has some pelvic pain during micturition. No fevers or chills.  Past Medical History  Diagnosis Date  . Hypercholesterolemia     Past Surgical History  Procedure Laterality Date  . Tonsillectomy    . Pilonidal cyst excision      Allergies: Review of patient's allergies indicates no known allergies.  Medications: Prior to Admission medications   Medication Sig Start Date End Date Taking? Authorizing Provider  acetaminophen (TYLENOL) 500 MG tablet Take 500 mg by mouth every 6 (six) hours as needed for fever.    Historical Provider, MD  amoxicillin-clavulanate (AUGMENTIN) 875-125 MG tablet Take 1 tablet by mouth every 12 (twelve) hours. 01/12/15   Clydia Llano, MD  aspirin EC 81 MG tablet Take 81 mg by mouth daily.    Historical Provider, MD  atorvastatin (LIPITOR) 20 MG tablet Take 20 mg by mouth at bedtime. 12/22/14   Historical Provider, MD  Cholecalciferol (VITAMIN D) 2000 UNITS tablet Take 2,000 Units by mouth daily.    Historical Provider, MD  ibuprofen (ADVIL,MOTRIN) 200 MG tablet Take 400-600 mg by mouth 2 (two) times daily as needed for fever (pain).    Historical Provider, MD  omeprazole (PRILOSEC) 20 MG capsule Take 20 mg by mouth daily. 10/28/14   Historical Provider, MD  PARoxetine (PAXIL) 20 MG tablet Take 20 mg  by mouth daily. 12/18/14   Historical Provider, MD  tamsulosin (FLOMAX) 0.4 MG CAPS capsule Take 0.4 mg by mouth at bedtime. 12/16/14   Historical Provider, MD     No family history on file.  Social History   Social History  . Marital Status: Married    Spouse Name: N/A  . Number of Children: N/A  . Years of Education: N/A   Social History Main Topics  . Smoking status: Current Every Day Smoker -- 1.00 packs/day    Types: Cigarettes  . Smokeless tobacco: Not on file  . Alcohol Use: 33.6 oz/week    56 Shots of liquor per week     Comment: 3-4 drinks a day with 2-3 shots in each drink  . Drug Use: No  . Sexual Activity: Not on file   Other Topics Concern  . Not on file   Social History Narrative    ECOG Status: 1 - Symptomatic but completely ambulatory  Review of Systems: A 12 point ROS discussed and pertinent positives are indicated in the HPI above.  All other systems are negative.  Review of Systems  Vital Signs: BP 123/80 mmHg  Pulse 54  Temp(Src) 98 F (36.7 C) (Oral)  SpO2 100%  Physical Exam  Mallampati Score:     Imaging: Dg Chest 2 View  12/28/2014  CLINICAL DATA:  Fever and shortness of breath for 2 days.  Smoker. EXAM: CHEST  2 VIEW COMPARISON:  None. FINDINGS: Lower thoracic spondylosis. Midline trachea. Normal heart size  and mediastinal contours. No pleural effusion or pneumothorax. Diffuse peribronchial thickening. No lobar consolidation. IMPRESSION: 1.  No acute cardiopulmonary disease. 2. Mild peribronchial thickening which may relate to chronic bronchitis or smoking. Electronically Signed   By: Jeronimo Greaves M.D.   On: 12/28/2014 18:45   Ct Abdomen Pelvis W Contrast  01/07/2015  CLINICAL DATA:  Fever of unknown origin. EXAM: CT ABDOMEN AND PELVIS WITH CONTRAST TECHNIQUE: Multidetector CT imaging of the abdomen and pelvis was performed using the standard protocol following bolus administration of intravenous contrast. CONTRAST:  OMNIPAQUE  IOHEXOL 300 MG/ML  SOLN COMPARISON:  None. FINDINGS: Lower chest:  Normal. Hepatobiliary: Complex multi septated cystic lesion in the posterior medial aspect of the right lobe of the liver measuring 7.7 x 7.6 x 5.1 cm, most likely a hepatic abscess. Pancreas: Normal. Spleen: Normal. Adrenals/Urinary Tract: Normal adrenal glands. Normal kidneys and ureters. 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 There is a small air-containing 12 mm abscess adjacent to the abscess in the bladder wall. The fistula to the sigmoid portion of the colon is best seen on image 54 of series 6 and image 40 of series 5. This is immediately posterior to the anterior abdominal wall. Stomach/Bowel: Numerous diverticula in the distal colon with a focal fistula to the wall of the dome of the bladder. Terminal ileum and appendix are normal. Vascular/Lymphatic: No significant adenopathy. Aortic and iliac atherosclerosis. Reproductive: Normal. Other: No free air or free fluid. Musculoskeletal: No acute osseous abnormality. Large benign bone island in the head of the left femur with a smaller bone island in the left greater trochanter. Old Schmorl's node in the superior endplate of L2. IMPRESSION: 1. Complex cystic mass in the posterior medial aspect of the right lobe of the liver, most likely a liver abscess. 2. 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 Tiny adjacent 12 mm abscess containing air. Extensive adjacent sigmoid diverticulosis. Electronically Signed   By: Francene Boyers M.D.   On: 01/07/2015 15:00   Dg Chest Port 1 View  01/07/2015  CLINICAL DATA:  55 year old male with sepsis and fever for 2 weeks. EXAM: PORTABLE CHEST 1 VIEW COMPARISON:  12/28/2014 chest radiograph FINDINGS: The cardiomediastinal silhouette is  unremarkable. There is no evidence of focal airspace disease, pulmonary edema, suspicious pulmonary nodule/mass, pleural effusion, or pneumothorax. No acute bony abnormalities are identified. Remote bilateral rib fractures are identified. IMPRESSION: No active disease. Electronically Signed   By: Harmon Pier M.D.   On: 01/07/2015 18:03   Ct Image Guided Drainage By Percutaneous Catheter  01/08/2015  CLINICAL DATA:  Hepatic abscess in the posterior right lobe requiring percutaneous drainage. EXAM: CT GUIDED DRAINAGE OF HEPATIC ABSCESS ANESTHESIA/SEDATION: 2.5 Mg IV Versed 175 mcg IV Fentanyl Total Moderate Sedation Time:  48 minutes PROCEDURE: The procedure, risks, benefits, and alternatives were explained to the patient. Questions regarding the procedure were encouraged and answered. The patient understands and consents to the procedure. A time-out was performed prior to the procedure. The right post a lateral abdominal wall was prepped with Betadine in a sterile fashion, and a sterile drape was applied covering the operative field. A sterile gown and sterile gloves were used for the procedure. Local anesthesia was provided with 1% Lidocaine. The patient  was initially placed in a prone position. The right side was rolled up slightly and CT localization performed of the liver. Under CT guidance, an 18 gauge trocar needle was advanced to the level of a posterior right hepatic abscess. After confirming needle tip position, a fluid sample was withdrawn. A guidewire was then advanced. The percutaneous tract was dilated over the wire and a 12 French percutaneous drainage catheter advanced. Catheter positioning was confirmed by CT. The catheter was flushed and connected to suction bulb drainage. The catheter was secured at the skin with a Prolene retention suture and StatLock device. COMPLICATIONS: None FINDINGS: Aspiration at the level of a dominant loculation of the posterior right hepatic abscess yielded grossly  purulent fluid. After placement of the drainage catheter, there is good return of fluid. IMPRESSION: CT-guided percutaneous drainage performed of posterior right hepatic abscess with placement of a 12 French drain. The drain was connected to suction bulb drainage. A sample of purulent fluid was sent for culture analysis. Electronically Signed   By: Irish Lack M.D.   On: 01/08/2015 17:32    Labs:  CBC:  Recent Labs  01/08/15 0320 01/09/15 0252 01/10/15 0323 01/11/15 0322  WBC 13.7* 13.3* 10.5 9.4  HGB 10.4* 9.9* 10.2* 9.9*  HCT 31.7* 29.1* 30.0* 30.5*  PLT 269 249 295 346    COAGS:  Recent Labs  01/07/15 1816 01/07/15 2245 01/08/15 1200 01/09/15 0252  INR  --  1.16 1.25 1.26  APTT 39*  --   --   --     BMP:  Recent Labs  01/08/15 0320 01/09/15 0252 01/10/15 0323 01/11/15 0322  NA 134* 134* 136 134*  K 3.9 3.7 3.9 4.0  CL 101 101 102 99*  CO2 GLUCOSE 127* 148* 133* 127*  BUN CALCIUM 8.4* 8.3* 8.5* 8.4*  CREATININE 1.08 0.90 0.98 1.03  GFRNONAA >60 >60 >60 >60  GFRAA >60 >60 >60 >60    LIVER FUNCTION TESTS:  Recent Labs  01/08/15 0320 01/09/15 0252 01/10/15 0323 01/11/15 0322  BILITOT 0.9 0.7 0.5 0.6  AST 42* 63* 46* 36  ALT 67* 73* 60 53  ALKPHOS 132* 108 110 134*  PROT 5.6* 5.6* 5.2* 5.2*  ALBUMIN 2.4* 2.2* 2.2* 2.2*    TUMOR MARKERS: No results for input(s): AFPTM, CEA, CA199, CHROMGRNA in the last 8760 hours.  Assessment and Plan:  My impression is that there has been significant improvement in his hepatic abscess post drain catheter placement. The majority of the low-attenuation fluid component has been evacuated by the drain catheter. The drain catheter remains in good position. There still some phlegmonous change around the drain catheter, and some small low-attenuation foci, so I would recommend drain catheter remain in place to allow this residual process to liquefy and drain if needed. There are no significant  undrained components and no need for an additional catheter or catheter manipulation. We discussed the expected time course for complete resolution of the liver abscess and I explained that these typically take some number of weeks to drain but has high hopes for complete recovery via  drain catheter alone. He seemed to understand. In the meantime, we'll decrease his flush volume to 5 mL, decrease frequency to twice a day. He scheduled meet with Gen. surgery on Monday to discuss the diverticulitis and possible surgical intervention. We'll pencil him in to see [place] back in about 2 weeks, but this can be modified  as needed  depending on surgical planning.  Thank you for this interesting consult.  I greatly enjoyed meeting Morgan Blasndres von [place] and look forward to participating in their care.  A copy of this report was sent to the requesting provider on this date.  Signed: Keyleigh Manninen III, DAYNE Hoover Grewe 01/22/2015, 11:38 AM   I spent a total of    15 Minutes in face to face in clinical consultation, greater than 50% of which was counseling/coordinating care for liver abscess drain catheter.

## 2015-01-29 ENCOUNTER — Encounter: Payer: Self-pay | Admitting: Internal Medicine

## 2015-01-29 ENCOUNTER — Ambulatory Visit (INDEPENDENT_AMBULATORY_CARE_PROVIDER_SITE_OTHER): Payer: BLUE CROSS/BLUE SHIELD | Admitting: Internal Medicine

## 2015-01-29 VITALS — BP 120/79 | HR 58 | Temp 98.6°F | Ht 68.0 in | Wt 185.5 lb

## 2015-01-29 DIAGNOSIS — K750 Abscess of liver: Secondary | ICD-10-CM | POA: Diagnosis not present

## 2015-01-29 NOTE — Assessment & Plan Note (Signed)
He has sigmoid diverticulitis complicated by a large liver abscess and possible colovesical fistula causing an intramural abscess of his bladder wall. He is improving with liver abscess drainage and antibiotic therapy. I agree with continued antibiotic therapy for at least one more month. He is scheduled for follow-up CT scan in 2-3 weeks. He will follow-up with me within 4 weeks.

## 2015-01-29 NOTE — Progress Notes (Signed)
Patient ID: Bobby Roth, male   DOB: 12/14/1960, 54 y.o.   MRN: [id number]         [facility]  Patient Active Problem List   Diagnosis Date Noted  . Colo-vesical fistula   . Diverticulitis of large intestine with abscess without bleeding   . Sepsis (HCC)   . Alcoholic cirrhosis of liver without ascites (HCC)   . Alcoholic hepatitis without ascites   . Alcohol abuse   . Diverticulitis 01/07/2015  . Colonic diverticular abscess 01/07/2015  . Liver abscess 01/07/2015    Patient's Medications  New Prescriptions   No medications on file  Previous Medications   ACETAMINOPHEN (TYLENOL) 500 MG TABLET    Take 500 mg by mouth every 6 (six) hours as needed for fever.   AMOXICILLIN-CLAVULANATE (AUGMENTIN) 875-125 MG TABLET    Take 1 tablet by mouth every 12 (twelve) hours.   ASPIRIN EC 81 MG TABLET    Take 81 mg by mouth daily.   ATORVASTATIN (LIPITOR) 20 MG TABLET    Take 20 mg by mouth at bedtime.   CHOLECALCIFEROL (VITAMIN D) 2000 UNITS TABLET    Take 2,000 Units by mouth daily.   IBUPROFEN (ADVIL,MOTRIN) 200 MG TABLET    Take 400-600 mg by mouth 2 (two) times daily as needed for fever (pain).   OMEPRAZOLE (PRILOSEC) 20 MG CAPSULE    Take 20 mg by mouth daily.   PAROXETINE (PAXIL) 20 MG TABLET    Take 20 mg by mouth daily.   TAMSULOSIN (FLOMAX) 0.4 MG CAPS CAPSULE    Take 0.4 mg by mouth at bedtime.   VARENICLINE (CHANTIX PAK) 0.5 MG X 11 & 1 MG X 42 TABLET    Take by mouth 2 (two) times daily. Take one 0.5 mg tablet by mouth once daily for 3 days, then increase to one 0.5 mg tablet twice daily for 4 days, then increase to one 1 mg tablet twice daily.  Modified Medications   No medications on file  Discontinued Medications   No medications on file    Subjective: Bobby Roth is in for his hospital follow-up visit. He was seen by my partner Dr. Algis LimingVanDam when he was admitted a little over 3 weeks ago with fever and abdominal pain. CT scan revealed a large  abscess in the posterior right lobe of his liver, sigmoid diverticulitis and possible fistula leading to an intramural bladder abscess. He underwent percutaneous drainage of the liver abscess with cultures yielding microaerophilic streptococci. He defervesced and started to improve clinically. He was discharged with the drain and placed on oral amoxicillin clavulanate and has now completed 23 days of antibiotic therapy. He has had no problems tolerating his amoxicillin clavulanate. He has not had any more fevers, chills or sweats. His appetite is very good. He developed some recurrent suprapubic tenderness yesterday but otherwise is feeling better. Repeat CAT scan on 01/22/2015 showed significant improvement in the liver abscess without substantial change in the pelvis. His printing is catheter has remained in place. He was having problem with fluid leaking around the catheter. Interventional radiology instructed him to decrease the amount of his drain flushes to 5 mL's of saline twice daily. His recent total, daily drain output has been only about 5 mL's of clear fluid.   Review of Systems: Pertinent items are noted in HPI.  Past Medical History  Diagnosis Date  . Hypercholesterolemia     Social History  Substance Use Topics  . Smoking  status: Current Every Day Smoker -- 0.50 packs/day    Types: Cigarettes  . Smokeless tobacco: None     Comment: trying to quit, he is taking Chantix  . Alcohol Use: 33.6 oz/week    56 Shots of liquor per week     Comment: cutting down on this as well    No family history on file.  No Known Allergies  Objective: Filed Vitals:   01/29/15 1519  BP: 120/79  Pulse: 58  Temp: 98.6 F (37 C)  TempSrc: Oral  Height:  (1.727 m)  Weight: 185 lb 8 oz (84.142 kg)   Body mass index is 28.21 kg/(m^2).  GenerHe is well dressed and in no distress Skin:No rash LungsClear Cor: Regular S1 and S2 no murmurs Abdomen: Mild suprapubic tenderness. No palpable  masses. A very small amount of clear fluid in right upper quadrant drain bulb   Lab Results Liver abscess 01/08/2015: Microaerophilic streptococci  CT scan of abdomen and pelvis 01/22/2015  IMPRESSION: 1. Interval percutaneous drainage of an abscess posteriorly in the right hepatic lobe. The low-density components of this abscess have decreased in size. There is a persistent phlegmon. 2. Slight improvement in pelvic inflammatory changes associated with the sigmoid colon consistent with improving sigmoid diverticulitis. 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   Electronically Signed  By: Carey Bullocks M.D.  On: 01/22/2015 11:59   Problem List Items Addressed This Visit      Unprioritized   Liver abscess - Primary    He has sigmoid diverticulitis complicated by a large liver abscess and possible colovesical fistula causing an intramural abscess of his bladder wall. He is improving with liver abscess drainage and antibiotic therapy. I agree with continued antibiotic therapy for at least one more month. He is scheduled for follow-up CT scan in 2-3 weeks. He will follow-up with me within 4 weeks.          Cliffton Asters, MD [facility] [phone] pager   [phone] cell 01/29/2015, 4:20 PM

## 2015-02-05 ENCOUNTER — Other Ambulatory Visit: Payer: BLUE CROSS/BLUE SHIELD

## 2015-02-10 ENCOUNTER — Other Ambulatory Visit (HOSPITAL_COMMUNITY): Payer: Self-pay | Admitting: General Surgery

## 2015-02-10 ENCOUNTER — Other Ambulatory Visit: Payer: BLUE CROSS/BLUE SHIELD

## 2015-02-10 ENCOUNTER — Other Ambulatory Visit: Payer: Self-pay | Admitting: General Surgery

## 2015-02-10 ENCOUNTER — Other Ambulatory Visit (HOSPITAL_COMMUNITY): Payer: Self-pay | Admitting: Interventional Radiology

## 2015-02-10 ENCOUNTER — Ambulatory Visit (HOSPITAL_COMMUNITY)
Admission: RE | Admit: 2015-02-10 | Discharge: 2015-02-10 | Disposition: A | Discharge status: Home / Self Care | Payer: BLUE CROSS/BLUE SHIELD | Source: Ambulatory Visit | Attending: General Surgery | Admitting: General Surgery

## 2015-02-10 DIAGNOSIS — K5780 Diverticulitis of intestine, part unspecified, with perforation and abscess without bleeding: Secondary | ICD-10-CM

## 2015-02-10 DIAGNOSIS — K5720 Diverticulitis of large intestine with perforation and abscess without bleeding: Secondary | ICD-10-CM

## 2015-02-13 ENCOUNTER — Other Ambulatory Visit: Payer: Self-pay | Admitting: General Surgery

## 2015-02-13 DIAGNOSIS — K750 Abscess of liver: Secondary | ICD-10-CM

## 2015-02-17 ENCOUNTER — Ambulatory Visit
Admission: RE | Admit: 2015-02-17 | Discharge: 2015-02-17 | Disposition: A | Discharge status: Home / Self Care | Payer: BLUE CROSS/BLUE SHIELD | Source: Ambulatory Visit | Attending: Interventional Radiology | Admitting: Interventional Radiology

## 2015-02-17 ENCOUNTER — Other Ambulatory Visit: Payer: BLUE CROSS/BLUE SHIELD

## 2015-02-17 ENCOUNTER — Ambulatory Visit
Admission: RE | Admit: 2015-02-17 | Discharge: 2015-02-17 | Disposition: A | Discharge status: Home / Self Care | Payer: BLUE CROSS/BLUE SHIELD | Source: Ambulatory Visit | Attending: Surgery | Admitting: Surgery

## 2015-02-17 DIAGNOSIS — K219 Gastro-esophageal reflux disease without esophagitis: Secondary | ICD-10-CM | POA: Insufficient documentation

## 2015-02-17 DIAGNOSIS — K750 Abscess of liver: Secondary | ICD-10-CM

## 2015-02-17 MED ORDER — IOPAMIDOL (ISOVUE-300) INJECTION 61%
100.0000 mL | Freq: Once | INTRAVENOUS | Status: AC | PRN
  Administered 2015-02-17: 100 mL via INTRAVENOUS

## 2015-02-17 NOTE — Progress Notes (Signed)
Referring Physician(s) Dr Romie Levee  Chief Complaint: The patient is seen in follow up today s/p hepatic abscess drain placed 01/08/2015  History of present illness:  Pt with new dx diverticulitis Followed with Dr Romie Levee regarding same Hepatic abscess noted on CT 01/07/15 after evaluation of fever of unknown origin Hepatic abscess drain placed 01/08/15 Now has had very little output for last several days Serous color Still on Augmentin BID per ID MD   Past Medical History  Diagnosis Date  . Hypercholesterolemia     Past Surgical History  Procedure Laterality Date  . Tonsillectomy    . Pilonidal cyst excision      Allergies: Review of patient's allergies indicates no known allergies.  Medications: Prior to Admission medications   Medication Sig Start Date End Date Taking? Authorizing Provider  acetaminophen (TYLENOL) 500 MG tablet Take 500 mg by mouth every 6 (six) hours as needed for fever.    Historical Provider, MD  amoxicillin-clavulanate (AUGMENTIN) 875-125 MG tablet Take 1 tablet by mouth every 12 (twelve) hours. 01/12/15   Clydia Llano, MD  aspirin EC 81 MG tablet Take 81 mg by mouth daily.    Historical Provider, MD  atorvastatin (LIPITOR) 20 MG tablet Take 20 mg by mouth at bedtime. 12/22/14   Historical Provider, MD  Cholecalciferol (VITAMIN D) 2000 UNITS tablet Take 2,000 Units by mouth daily.    Historical Provider, MD  ibuprofen (ADVIL,MOTRIN) 200 MG tablet Take 400-600 mg by mouth 2 (two) times daily as needed for fever (pain).    Historical Provider, MD  omeprazole (PRILOSEC) 20 MG capsule Take 20 mg by mouth daily. 10/28/14   Historical Provider, MD  PARoxetine (PAXIL) 20 MG tablet Take 20 mg by mouth daily. 12/18/14   Historical Provider, MD  tamsulosin (FLOMAX) 0.4 MG CAPS capsule Take 0.4 mg by mouth at bedtime. 12/16/14   Historical Provider, MD  varenicline (CHANTIX PAK) 0.5 MG X 11 & 1 MG X 42 tablet Take by mouth 2 (two) times daily. Take one  0.5 mg tablet by mouth once daily for 3 days, then increase to one 0.5 mg tablet twice daily for 4 days, then increase to one 1 mg tablet twice daily.    Historical Provider, MD     No family history on file.  Social History   Social History  . Marital Status: Married    Spouse Name: N/A  . Number of Children: N/A  . Years of Education: N/A   Social History Main Topics  . Smoking status: Current Every Day Smoker -- 0.50 packs/day    Types: Cigarettes  . Smokeless tobacco: Not on file     Comment: trying to quit, he is taking Chantix  . Alcohol Use: 33.6 oz/week    56 Shots of liquor per week     Comment: cutting down on this as well  . Drug Use: No  . Sexual Activity: Not on file   Other Topics Concern  . Not on file   Social History Narrative     Vital Signs: T: 98.1  Physical Exam  Abdominal: Soft. Bowel sounds are normal. He exhibits no distension. There is no tenderness.  Skin: Skin is warm.  Site of drain is clean and dry Very small reddened area at site Minimally tender No sign of infection  Area of approx 4 x 4 inches skin surrounding drain site is reddened from stat lock and tape of dressing daily No infection noted Tender to touch  Imaging: No results found.  Labs:  CBC:  Recent Labs  01/08/15 0320 01/09/15 0252 01/10/15 0323 01/11/15 0322  WBC 13.7* 13.3* 10.5 9.4  HGB 10.4* 9.9* 10.2* 9.9*  HCT 31.7* 29.1* 30.0* 30.5*  PLT 269 249 295 346    COAGS:  Recent Labs  01/07/15 1816 01/07/15 2245 01/08/15 1200 01/09/15 0252  INR  --  1.16 1.25 1.26  APTT 39*  --   --   --     BMP:  Recent Labs  01/08/15 0320 01/09/15 0252 01/10/15 0323 01/11/15 0322  NA 134* 134* 136 134*  K 3.9 3.7 3.9 4.0  CL 101 101 102 99*  CO2 25 25 26 28   GLUCOSE 127* 148* 133* 127*  BUN 9 7 6 8   CALCIUM 8.4* 8.3* 8.5* 8.4*  CREATININE 1.08 0.90 0.98 1.03  GFRNONAA >60 >60 >60 >60  GFRAA >60 >60 >60 >60    LIVER FUNCTION TESTS:  Recent  Labs  01/08/15 0320 01/09/15 0252 01/10/15 0323 01/11/15 0322  BILITOT 0.9 0.7 0.5 0.6  AST 42* 63* 46* 36  ALT 67* 73* 60 53  ALKPHOS 132* 108 110 134*  PROT 5.6* 5.6* 5.2* 5.2*  ALBUMIN 2.4* 2.2* 2.2* 2.2*    Assessment:  Hepatic abscess Drain placed 01/08/15 Doing well CT today reveals resolved collection Removed drain without complication per Dr Lowella DandyHenn  Pt still to follow with Dr Maisie Fushomas regarding diverticulitis and surgical plan.   SignedRalene Muskrat: Paden Senger A 02/17/2015, 11:33 AM   Please refer to Dr. Lowella DandyHenn attestation of this note for management and plan.

## 2015-02-26 ENCOUNTER — Encounter: Payer: Self-pay | Admitting: Internal Medicine

## 2015-02-26 ENCOUNTER — Ambulatory Visit (INDEPENDENT_AMBULATORY_CARE_PROVIDER_SITE_OTHER): Payer: BLUE CROSS/BLUE SHIELD | Admitting: Internal Medicine

## 2015-02-26 VITALS — BP 144/80 | HR 51 | Temp 97.5°F | Wt 180.0 lb

## 2015-02-26 DIAGNOSIS — K5720 Diverticulitis of large intestine with perforation and abscess without bleeding: Secondary | ICD-10-CM | POA: Diagnosis not present

## 2015-02-26 NOTE — Progress Notes (Signed)
[facility]  Patient Active Problem List   Diagnosis Date Noted  . Acid reflux 02/17/2015  . Hepatic abscess   . Colo-vesical fistula   . Diverticulitis of large intestine with abscess without bleeding   . Sepsis (HCC)   . Alcoholic cirrhosis of liver without ascites (HCC)   . Alcoholic hepatitis without ascites   . Alcohol abuse   . Diverticulitis 01/07/2015  . Colonic diverticular abscess 01/07/2015  . Liver abscess 01/07/2015  . Difficult or painful urination 11/17/2014  . 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 Triggering of digit 03/13/2014  . Current tobacco use 04/04/2013  . HLD (hyperlipidemia) 04/04/2013  . Ejaculates too soon 12/28/2011    Patient's Medications  New Prescriptions   No medications on file  Previous Medications   ACETAMINOPHEN (TYLENOL) 500 MG TABLET    Take 500 mg by mouth every 6 (six) hours as needed for fever.   AMOXICILLIN-CLAVULANATE (AUGMENTIN) 875-125 MG TABLET    Take 1 tablet by mouth every 12 (twelve) hours.   ASPIRIN EC 81 MG TABLET    Take 81 mg by mouth daily.   ATORVASTATIN (LIPITOR) 20 MG TABLET    Take 20 mg by mouth at bedtime.   CHOLECALCIFEROL (VITAMIN D) 2000 UNITS TABLET    Take 2,000 Units by mouth daily.   IBUPROFEN (ADVIL,MOTRIN) 200 MG TABLET    Take 400-600 mg by mouth 2 (two) times daily as needed for fever (pain).   OMEPRAZOLE (PRILOSEC) 20 MG CAPSULE    Take 20 mg by mouth daily.   PAROXETINE (PAXIL) 20 MG TABLET    Take 20 mg by mouth daily.   TAMSULOSIN (FLOMAX) 0.4 MG CAPS CAPSULE    Take 0.4 mg by mouth at bedtime.   VARENICLINE (CHANTIX PAK) 0.5 MG X 11 & 1 MG X 42 TABLET    Take by mouth 2 (two) times daily. Take one 0.5 mg tablet by mouth once daily for 3 days, then increase to one 0.5 mg tablet twice daily for 4 days, then increase to one 1 mg tablet twice daily.  Modified Medications   No medications on file  Discontinued Medications   No medications on file     Subjective: Mr. Martell Mcfadyen is in for his routine follow-up visit. He is now completed 51 days of antibiotic therapy for his diverticulitis and liver abscess. He has been taking amoxicillin clavulanate without any side effects. He had a repeat CT scan done on 02/17/2015 which showed that his liver abscess had resolved. His percutaneous drain was removed. Unfortunately that CT scan was a limited study and did not obtain images of his distal colon or bladder so his diverticulitis and possible colovesical fistula could not be reassessed.  He is feeling much better. He still has some occasional abdominal discomfort and intermittent bladder discomfort when voiding but both of these are much better. He has not had any problems with his appetite. He has had no fever, nausea, vomiting or diarrhea. He is scheduled to see his general surgeon, Dr. Romie Levee, on 03/02/2015.  Review of Systems: Review of Systems  Constitutional: Negative for fever, chills, weight loss, malaise/fatigue and diaphoresis.  HENT: Negative for sore throat.   Respiratory: Negative for cough, sputum production and shortness of breath.   Cardiovascular: Negative for chest pain.  Gastrointestinal: Positive for abdominal pain. Negative for nausea, vomiting and diarrhea.  Genitourinary: Negative for dysuria and frequency.  Intermittent suprapubic discomfort when voiding. No dysuria.  Musculoskeletal: Negative for myalgias and joint pain.  Skin: Negative for rash.  Neurological: Negative for focal weakness.  Psychiatric/Behavioral: Negative for depression and substance abuse. The patient is not nervous/anxious.     Past Medical History  Diagnosis Date  . Hypercholesterolemia     Social History  Substance Use Topics  . Smoking status: Former Smoker -- 0.50 packs/day    Types: Cigarettes    Quit date: 02/02/2015  . Smokeless tobacco: None     Comment: trying to quit, he is taking Chantix  . Alcohol Use: 33.6 oz/week     56 Shots of liquor per week     Comment: cutting down on this as well    No family history on file.  No Known Allergies  Objective: Filed Vitals:   02/26/15 1455  BP: 144/80  Pulse: 51  Temp: 97.5 F (36.4 C)  TempSrc: Oral  Weight: 180 lb (81.647 kg)   Body mass index is 27.38 kg/(m^2).  Physical Exam  Constitutional:  He is well dressed and in good spirits.  Abdominal: Soft. Bowel sounds are normal. He exhibits no distension. There is no tenderness.    CT ABDOMEN WITH CONTRAST 02/17/2015  TECHNIQUE: Multidetector CT imaging of the abdomen was performed using the standard protocol following bolus administration of intravenous contrast.  CONTRAST: ISOVUE-300 IOPAMIDOL (ISOVUE-300) INJECTION 61%  COMPARISON: 01/22/2015  FINDINGS: Lower chest: Mild atelectasis at the right lung base. No pleural effusions.  Hepatobiliary: Stable position of the percutaneous drainage catheter in the liver. Drainage catheter terminates in the posterior medial aspect of the liver. There is no significant fluid surrounding this drainage cavity. There is mild residual low density in this area compatible with inflammation. No significant fluid collection at the drain skin site. No new liver collections or abscesses. Normal appearance of the gallbladder. Portal venous system is patent.  Pancreas: Normal appearance of the pancreas without inflammation or duct dilatation.  Spleen: Normal appearance of spleen without enlargement.  Adrenals/Urinary Tract: Normal appearance of bilateral adrenal glands. Normal appearance of both kidneys without suspicious lesions or inflammation.  Stomach/Bowel: Normal appearance of the stomach, duodenum and small bowel. No evidence for bowel dilatation or obstruction. There are diverticula involving the visualized portions of the colon without acute colonic inflammation.  Vascular/Lymphatic: Atherosclerotic calcifications in the  aorta and iliac arteries without aneurysm. Again noted are small periaortic lymph nodes. No significant lymphadenopathy.  Other: No free fluid.  Musculoskeletal: No acute bone abnormality. There is a stable deformity along the L2 superior endplate probably related to endplate degenerative disease and a large Schmorl's node.  IMPRESSION: The hepatic abscess has resolved. No new liver abscesses.  The percutaneous drain was removed following this procedure.   Electronically Signed  By: Richarda Overlie M.D.  On: 02/17/2015 12:19    Problem List Items Addressed This Visit      Unprioritized   Diverticulitis of large intestine with abscess without bleeding - Primary    He is improving after nearly 2 months of antibiotic therapy for his complicated diverticulitis and liver abscess. I will continue amoxicillin clavulanate now but will need to discuss optimal duration of antibiotic therapy, timing of elective surgery and whether or not he will need a repeat pelvic CT scan with Dr. Maisie Fus after his visit with her next week.          Cliffton Asters, MD [facility] [phone] pager   [phone]  cell 02/26/2015, 3:15 PM

## 2015-02-26 NOTE — Assessment & Plan Note (Signed)
He is improving after nearly 2 months of antibiotic therapy for his complicated diverticulitis and liver abscess. I will continue amoxicillin clavulanate now but will need to discuss optimal duration of antibiotic therapy, timing of elective surgery and whether or not he will need a repeat pelvic CT scan with Dr. Maisie Fushomas after his visit with her next week.

## 2015-03-03 ENCOUNTER — Other Ambulatory Visit: Payer: Self-pay | Admitting: General Surgery

## 2015-03-03 NOTE — H&P (Signed)
Bobby Roth 03/02/2015 4:31 PM Location: [facility] Patient #: [id number] DOB: 12/27/1960 Married / Language: Undefined / Race: Refused to Report/Unreported Male  History of Present Illness Bobby Levee MD; 03/03/2015 10:34 AM) The patient is a 54 year old male who presents with diverticulitis. 54 year old male presents to the hospital with fevers and liver abscess. On evaluation of the patient's abdomen, a small pericolonic abscess could be noted at the dome of the bladder. To my knowledge this was treated with antibiotics alone and a drain was placed in the liver abscess. His fever subsided and he was discharged to home. Repeat CT scan shows improvement in the liver abscess and the diverticulitis. He denies any pneumaturia or dysuria. He does feel some pain at the top of his bladder when he urinates. He denies any fever or changes in his bowel habits. His drain has been removed and his liver abscess has resolved. He saw Dr. Orvan Falconer and it was felt that he most likely could come off of his antibiotics. He is doing well he is not losing weight and he has a good appetite.   Problem List/Past Medical Bobby Levee, MD; 03/03/2015 10:34 AM) DIVERTICULITIS OF LARGE INTESTINE WITH PERFORATION AND ABSCESS WITHOUT BLEEDING (K57.20) BACTERIAL LIVER ABSCESS (K75.0)  Other Problems Bobby Levee, MD; 03/03/2015 10:34 AM) Gastroesophageal Reflux Disease Diverticulosis Back Pain  Past Surgical History Bobby Levee, MD; 03/03/2015 10:34 AM) Tonsillectomy Colon Polyp Removal - Colonoscopy  Diagnostic Studies History Bobby Levee, MD; 03/03/2015 10:34 AM) Colonoscopy 1-5 years ago  Allergies Fay Records, CMA; 03/02/2015 4:32 PM) No Known Drug Allergies 01/26/2015  Medication History Bobby Levee, MD; 03/03/2015 10:34 AM) Amoxicillin (  Tablet, Oral) Active. Lipitor (  Tablet, Oral) Active. Vitamin D (2000UNIT Tablet, Oral) Active. PriLOSEC  (  Capsule DR, Oral) Active. Paxil (  Tablet, Oral) Active. Aspirin (  Tablet, Oral) Active. Medications Reconciled Chantix Starting Month Pak (0.5 MG X 11 &1 MG X 42 Tablet, Oral) Active. Neomycin Sulfate (  Tablet, 2 (two) Tablet Oral SEE NOTE, Taken starting 03/02/2015) Active. (TAKE TWO TABLETS AT 2 PM, 3 PM, AND 10 PM THE DAY PRIOR TO SURGERY) Flagyl (  Tablet, 2 (two) Tablet Oral SEE NOTE, Taken starting 03/02/2015) Active. (Take at 2pm, 3pm, and 10pm the day prior to your colon operation)  Social History Bobby Levee, MD; 03/03/2015 10:34 AM) Alcohol use Moderate alcohol use. Tobacco use Current every day smoker. Caffeine use Carbonated beverages, Coffee. No drug use  Family History Bobby Levee, MD; 03/03/2015 10:34 AM) Seizure disorder Brother. Migraine Headache Brother. Alcohol Abuse Father. Colon Cancer Family Members In General. Heart Disease Father. Heart disease in male family member before age 32 Colon Polyps Brother. Depression Brother.    Vitals Fay Records CMA; 03/02/2015 4:32 PM) 03/02/2015 4:32 PM Weight: 187 lb Height: 69in Body Surface Area: 2.01 m Body Mass Index: 27.61 kg/m  Temp.: 97.15F(Temporal)  Pulse: 89 (Regular)  BP: 130/70 (Sitting, Left Arm, Standard)      Physical Exam Bobby Levee MD; 03/03/2015 10:34 AM)  General Mental Status-Alert. General Appearance-Not in acute distress. Build & Nutrition-Well nourished. Posture-Normal posture. Gait-Normal.  Head and Neck Head-normocephalic, atraumatic with no lesions or palpable masses. Trachea-midline.  Chest and Lung Exam Chest and lung exam reveals -on auscultation, normal breath sounds, no adventitious sounds and normal vocal resonance.  Cardiovascular Cardiovascular examination reveals -normal heart sounds, regular rate and rhythm with no murmurs.  Abdomen Inspection Inspection of the abdomen reveals - No  Hernias. Palpation/Percussion Palpation and Percussion of the abdomen  reveal - Soft, No Rigidity (guarding) and No Palpable abdominal masses. Note: Slightly tender to palpation in the suprapubic region.  Neurologic Neurologic evaluation reveals -alert and oriented x 3 with no impairment of recent or remote memory, normal attention span and ability to concentrate, normal sensation and normal coordination.  Musculoskeletal Normal Exam - Bilateral-Upper Extremity Strength Normal and Lower Extremity Strength Normal.    Assessment & Plan Bobby Roth(Izaan Kingbird MD; 03/03/2015 10:32 AM)  DIVERTICULITIS OF LARGE INTESTINE WITH PERFORATION AND ABSCESS WITHOUT BLEEDING (K57.20) Impression: Patient's liver abscess has resolved. He continues to have some pain above his bladder with urination. He denies any fever or changes in his bowel habits. He is currently still on his antibiotics. I recommended that he stop the antibiotics and call the office if his symptoms worsened after that. If that occurs, we will have him continue antibiotics until surgery. I have recommended that he have surgery in early January at the earliest. We will go ahead and get this scheduled. In the meantime he will need a colonoscopy. [facility] We discussed the surgery in detail. I have recommended a robotic approach. The surgery and anatomy were described to the patient as well as the risks of surgery and the possible complications. These include: Bleeding, deep abdominal infections and possible wound complications such as hernia and infection, damage to adjacent structures, leak of surgical connections, which can lead to other surgeries and possibly an ostomy, possible need for other procedures, such as abscess drains in radiology, possible prolonged hospital stay, possible diarrhea from removal of part of the colon, possible constipation from narcotics, possible bowel, bladder or sexual  dysfunction if having rectal surgery, prolonged fatigue/weakness or appetite loss, possible early recurrence of of disease, possible complications of their medical problems such as heart disease or arrhythmias or lung problems, death (less than 1%). I believe the patient understands and wishes to proceed with the surgery.

## 2015-03-04 ENCOUNTER — Telehealth: Payer: Self-pay | Admitting: Internal Medicine

## 2015-03-04 NOTE — Telephone Encounter (Signed)
 -----   Message -----    From: Romie LeveeAlicia Thomas, MD    Sent: 03/04/2015   8:44 AM      To: Cliffton AstersJohn Robinn Overholt, MD  I told him to stop his abx and see if his symptoms worsened.  If so, we will keep him on abx til surgery.  I don't feel strongly about repeating CT of his pelvis.  Thanks so much for helping take care of him.   Alicia   ===View-only below this line===  Helmut Musterlicia,  Thanks this is very helpful.  Jonny RuizJohn

## 2015-04-02 ENCOUNTER — Encounter (HOSPITAL_COMMUNITY): Payer: Self-pay

## 2015-04-02 NOTE — Patient Instructions (Addendum)
YOUR PROCEDURE IS SCHEDULED ON : 04/08/15  REPORT TO [facility] MAIN ENTRANCE FOLLOW SIGNS TO EAST ELEVATOR - GO TO 3rd FLOOR CHECK IN AT 3 EAST NURSES STATION (SHORT STAY) AT:  10:30 AM  CALL THIS NUMBER IF YOU HAVE PROBLEMS THE MORNING OF SURGERY [phone]  REMEMBER:ONLY 1 PER PERSON MAY GO TO SHORT STAY WITH YOU TO GET READY THE MORNING OF YOUR SURGERY  DO NOT EAT FOOD  AFTER MIDNIGHT  MAY HAVE CLAR LIQUIDS UNTIL 6:40 AM  TAKE THESE MEDICINES THE MORNING OF SURGERY:  OMEPRAZOLE / PAXIL / AUGMENTIN  STOP ASPIRIN / IBUPROFEN / ALEVE / VITAMINS / HERBAL MEDS __5__ DAYS BEFORE SURGERY  CLEAR LIQUID DIET  Foods Allowed                                                                     Foods Excluded  Coffee and tea, regular and decaf                             liquids that you cannot  Plain Jell-O in any flavor                                             see through such as: Fruit ices (not with fruit pulp)                                     milk, soups, orange juice  Iced Popsicles                                                        All solid food Carbonated beverages, regular and diet                                    Cranberry, grape and apple juices Sports drinks like Gatorade Lightly seasoned clear broth or consume(fat free) Sugar, honey syrup   _____________________________________________________________________    YOU MAY NOT HAVE ANY METAL ON YOUR BODY INCLUDING HAIR PINS AND PIERCING'S. DO NOT WEAR JEWELRY, MAKEUP, LOTIONS, POWDERS OR PERFUMES. DO NOT WEAR NAIL POLISH. DO NOT SHAVE 48 HRS PRIOR TO SURGERY. MEN MAY SHAVE FACE AND NECK.  DO NOT BRING VALUABLES TO HOSPITAL. [facility] IS NOT RESPONSIBLE FOR VALUABLES.  CONTACTS, DENTURES OR PARTIALS MAY NOT BE WORN TO SURGERY. LEAVE SUITCASE IN CAR. CAN BE BROUGHT TO ROOM AFTER SURGERY.  PATIENTS DISCHARGED THE DAY OF SURGERY WILL NOT BE ALLOWED TO DRIVE HOME.  PLEASE READ OVER THE  FOLLOWING INSTRUCTION SHEETS _________________________________________________________________________________  [facility] - PREPARING FOR SURGERY  Before surgery, you can play an important role.  Because skin is not sterile, your skin needs to be as free of germs as possible.  You can reduce the number of germs on your skin by washing with CHG (chlorahexidine gluconate) soap before surgery.  CHG is an antiseptic cleaner which kills germs and bonds with the skin to continue killing germs even after washing. Please DO NOT use if you have an allergy to CHG or antibacterial soaps.  If your skin becomes reddened/irritated stop using the CHG and inform your nurse when you arrive at Short Stay. Do not shave (including legs and underarms) for at least 48 hours prior to the first CHG shower.  You may shave your face. Please follow these instructions carefully:   1.  Shower with CHG Soap the night before surgery and the  morning of Surgery.   2.  If you choose to wash your hair, wash your hair first as usual with your  normal  Shampoo.   3.  After you shampoo, rinse your hair and body thoroughly to remove the  shampoo.                                         4.  Use CHG as you would any other liquid soap.  You can apply chg directly  to the skin and wash . Gently wash with scrungie or clean wascloth    5.  Apply the CHG Soap to your body ONLY FROM THE NECK DOWN.   Do not use on open                           Wound or open sores. Avoid contact with eyes, ears mouth and genitals (private parts).                        Genitals (private parts) with your normal soap.              6.  Wash thoroughly, paying special attention to the area where your surgery  will be performed.   7.  Thoroughly rinse your body with warm water from the neck down.   8.  DO NOT shower/wash with your normal soap after using and rinsing off  the CHG Soap .                9.  Pat  yourself dry with a clean towel.             10.  Wear clean night clothes to bed after shower             11.  Place clean sheets on your bed the night of your first shower and do not  sleep with pets.  Day of Surgery : Do not apply any lotions/deodorants the morning of surgery.  Please wear clean clothes to the hospital/surgery center.  FAILURE TO FOLLOW THESE INSTRUCTIONS MAY RESULT IN THE CANCELLATION OF YOUR SURGERY    PATIENT SIGNATURE_________________________________  ______________________________________________________________________

## 2015-04-03 ENCOUNTER — Encounter (HOSPITAL_COMMUNITY)
Admission: RE | Admit: 2015-04-03 | Discharge: 2015-04-03 | Disposition: A | Discharge status: Home / Self Care | Payer: BLUE CROSS/BLUE SHIELD | Source: Ambulatory Visit | Attending: General Surgery | Admitting: General Surgery

## 2015-04-03 ENCOUNTER — Encounter (HOSPITAL_COMMUNITY): Payer: Self-pay

## 2015-04-03 DIAGNOSIS — K5792 Diverticulitis of intestine, part unspecified, without perforation or abscess without bleeding: Secondary | ICD-10-CM | POA: Insufficient documentation

## 2015-04-03 DIAGNOSIS — Z01818 Encounter for other preprocedural examination: Secondary | ICD-10-CM | POA: Diagnosis present

## 2015-04-03 HISTORY — DX: Diverticulitis of intestine, part unspecified, without perforation or abscess without bleeding: K57.92

## 2015-04-03 HISTORY — DX: Gastro-esophageal reflux disease without esophagitis: K21.9

## 2015-04-03 HISTORY — DX: Adverse effect of unspecified anesthetic, initial encounter: T41.45XA

## 2015-04-03 HISTORY — DX: Other complications of anesthesia, initial encounter: T88.59XA

## 2015-04-03 LAB — CBC
HCT: 43.6 % (ref 39.0–52.0)
HEMOGLOBIN: 14.2 g/dL (ref 13.0–17.0)
MCH: 30.8 pg (ref 26.0–34.0)
MCHC: 32.6 g/dL (ref 30.0–36.0)
MCV: 94.6 fL (ref 78.0–100.0)
Platelets: 197 10*3/uL (ref 150–400)
RBC: 4.61 MIL/uL (ref 4.22–5.81)
RDW: 13.6 % (ref 11.5–15.5)
WBC: 6 10*3/uL (ref 4.0–10.5)

## 2015-04-03 LAB — BASIC METABOLIC PANEL
ANION GAP: 9 (ref 5–15)
BUN: 20 mg/dL (ref 6–20)
CHLORIDE: 104 mmol/L (ref 101–111)
CO2: 27 mmol/L (ref 22–32)
Calcium: 9.8 mg/dL (ref 8.9–10.3)
Creatinine, Ser: 1 mg/dL (ref 0.61–1.24)
GFR calc non Af Amer: >60 mL/min (ref >60)
Glucose, Bld: 104 mg/dL — ABNORMAL HIGH (ref 65–99)
POTASSIUM: 4.3 mmol/L (ref 3.5–5.1)
SODIUM: 140 mmol/L (ref 135–145)

## 2015-04-03 LAB — ABO/RH: ABO/RH(D): O POS

## 2015-04-04 LAB — HEMOGLOBIN A1C
HEMOGLOBIN A1C: 5.6 % (ref 4.8–5.6)
MEAN PLASMA GLUCOSE: 114 mg/dL

## 2015-04-08 ENCOUNTER — Encounter (HOSPITAL_COMMUNITY)
Admission: RE | Disposition: A | Discharge status: Home Health | Payer: Self-pay | Source: Ambulatory Visit | Attending: General Surgery

## 2015-04-08 ENCOUNTER — Inpatient Hospital Stay (HOSPITAL_COMMUNITY)
Admission: RE | Admit: 2015-04-08 | Discharge: 2015-04-13 | DRG: 982 | Disposition: A | Discharge status: Home Health | Payer: BLUE CROSS/BLUE SHIELD | Source: Ambulatory Visit | Attending: General Surgery | Admitting: General Surgery

## 2015-04-08 ENCOUNTER — Inpatient Hospital Stay (HOSPITAL_COMMUNITY): Payer: BLUE CROSS/BLUE SHIELD | Admitting: Certified Registered"

## 2015-04-08 ENCOUNTER — Encounter (HOSPITAL_COMMUNITY): Payer: Self-pay | Admitting: *Deleted

## 2015-04-08 DIAGNOSIS — Z79899 Other long term (current) drug therapy: Secondary | ICD-10-CM | POA: Diagnosis not present

## 2015-04-08 DIAGNOSIS — N321 Vesicointestinal fistula: Principal | ICD-10-CM | POA: Diagnosis present

## 2015-04-08 DIAGNOSIS — Z800 Family history of malignant neoplasm of digestive organs: Secondary | ICD-10-CM

## 2015-04-08 DIAGNOSIS — H5711 Ocular pain, right eye: Secondary | ICD-10-CM | POA: Diagnosis present

## 2015-04-08 DIAGNOSIS — K219 Gastro-esophageal reflux disease without esophagitis: Secondary | ICD-10-CM | POA: Diagnosis present

## 2015-04-08 DIAGNOSIS — F17200 Nicotine dependence, unspecified, uncomplicated: Secondary | ICD-10-CM | POA: Diagnosis present

## 2015-04-08 DIAGNOSIS — Z01812 Encounter for preprocedural laboratory examination: Secondary | ICD-10-CM

## 2015-04-08 DIAGNOSIS — Z7982 Long term (current) use of aspirin: Secondary | ICD-10-CM | POA: Diagnosis not present

## 2015-04-08 DIAGNOSIS — K750 Abscess of liver: Secondary | ICD-10-CM | POA: Diagnosis present

## 2015-04-08 DIAGNOSIS — Z820 Family history of epilepsy and other diseases of the nervous system: Secondary | ICD-10-CM

## 2015-04-08 DIAGNOSIS — K5720 Diverticulitis of large intestine with perforation and abscess without bleeding: Secondary | ICD-10-CM | POA: Diagnosis present

## 2015-04-08 DIAGNOSIS — Z8249 Family history of ischemic heart disease and other diseases of the circulatory system: Secondary | ICD-10-CM

## 2015-04-08 DIAGNOSIS — Z86010 Personal history of colonic polyps: Secondary | ICD-10-CM

## 2015-04-08 DIAGNOSIS — Z818 Family history of other mental and behavioral disorders: Secondary | ICD-10-CM | POA: Diagnosis not present

## 2015-04-08 LAB — TYPE AND SCREEN
ABO/RH(D): O POS
Antibody Screen: NEGATIVE

## 2015-04-08 SURGERY — COLECTOMY, PARTIAL, ROBOT-ASSISTED, LAPAROSCOPIC
Anesthesia: General

## 2015-04-08 MED ORDER — PANTOPRAZOLE SODIUM 40 MG PO TBEC
40.0000 mg | DELAYED_RELEASE_TABLET | Freq: Every day | ORAL | Status: DC
  Administered 2015-04-09 – 2015-04-12 (×4): 40 mg via ORAL
  Filled 2015-04-08 (×5): qty 1

## 2015-04-08 MED ORDER — GLYCOPYRROLATE 0.2 MG/ML IJ SOLN
INTRAMUSCULAR | Status: AC
  Filled 2015-04-08: qty 3

## 2015-04-08 MED ORDER — FENTANYL CITRATE (PF) 100 MCG/2ML IJ SOLN
INTRAMUSCULAR | Status: AC
  Filled 2015-04-08: qty 2

## 2015-04-08 MED ORDER — MORPHINE SULFATE (PF) 2 MG/ML IV SOLN
2.0000 mg | INTRAVENOUS | Status: DC | PRN
  Administered 2015-04-08: 2 mg via INTRAVENOUS
  Administered 2015-04-09 (×4): 4 mg via INTRAVENOUS
  Administered 2015-04-09: 2 mg via INTRAVENOUS
  Administered 2015-04-09 – 2015-04-10 (×3): 4 mg via INTRAVENOUS
  Administered 2015-04-10: 2 mg via INTRAVENOUS
  Administered 2015-04-10: 4 mg via INTRAVENOUS
  Administered 2015-04-11 (×3): 2 mg via INTRAVENOUS
  Administered 2015-04-11: 4 mg via INTRAVENOUS
  Administered 2015-04-12: 2 mg via INTRAVENOUS
  Filled 2015-04-08: qty 2
  Filled 2015-04-08 (×3): qty 1
  Filled 2015-04-08 (×4): qty 2
  Filled 2015-04-08 (×3): qty 1
  Filled 2015-04-08 (×2): qty 2
  Filled 2015-04-08 (×3): qty 1
  Filled 2015-04-08: qty 2

## 2015-04-08 MED ORDER — METHYLENE BLUE 1 % INJ SOLN
INTRAMUSCULAR | Status: AC
  Filled 2015-04-08: qty 10

## 2015-04-08 MED ORDER — ACETAMINOPHEN 500 MG PO TABS
1000.0000 mg | ORAL_TABLET | Freq: Four times a day (QID) | ORAL | Status: AC
  Administered 2015-04-08 – 2015-04-09 (×3): 1000 mg via ORAL
  Filled 2015-04-08 (×3): qty 2

## 2015-04-08 MED ORDER — DIPHENHYDRAMINE HCL 12.5 MG/5ML PO ELIX: 12.5000 mg | ORAL_SOLUTION | Freq: Four times a day (QID) | ORAL | Status: DC | PRN

## 2015-04-08 MED ORDER — ONDANSETRON HCL 4 MG/2ML IJ SOLN: 4.0000 mg | Freq: Four times a day (QID) | INTRAMUSCULAR | Status: DC | PRN

## 2015-04-08 MED ORDER — NEOSTIGMINE METHYLSULFATE 10 MG/10ML IV SOLN
INTRAVENOUS | Status: AC
  Filled 2015-04-08: qty 1

## 2015-04-08 MED ORDER — HYDROMORPHONE HCL 1 MG/ML IJ SOLN
INTRAMUSCULAR | Status: DC | PRN
  Administered 2015-04-08: 1 mg via INTRAVENOUS
  Administered 2015-04-08 (×2): 0.5 mg via INTRAVENOUS

## 2015-04-08 MED ORDER — LIDOCAINE HCL (CARDIAC) 20 MG/ML IV SOLN
INTRAVENOUS | Status: AC
  Filled 2015-04-08: qty 5

## 2015-04-08 MED ORDER — DEXAMETHASONE SODIUM PHOSPHATE 10 MG/ML IJ SOLN
INTRAMUSCULAR | Status: DC | PRN
  Administered 2015-04-08: 10 mg via INTRAVENOUS

## 2015-04-08 MED ORDER — HYDROMORPHONE HCL 2 MG/ML IJ SOLN
INTRAMUSCULAR | Status: AC
  Filled 2015-04-08: qty 1

## 2015-04-08 MED ORDER — MIDAZOLAM HCL 2 MG/2ML IJ SOLN
INTRAMUSCULAR | Status: AC
  Filled 2015-04-08: qty 2

## 2015-04-08 MED ORDER — FENTANYL CITRATE (PF) 100 MCG/2ML IJ SOLN
INTRAMUSCULAR | Status: DC | PRN
  Administered 2015-04-08: 100 ug via INTRAVENOUS
  Administered 2015-04-08 (×2): 50 ug via INTRAVENOUS

## 2015-04-08 MED ORDER — CEFOTETAN DISODIUM-DEXTROSE 2-2.08 GM-% IV SOLR
2.0000 g | INTRAVENOUS | Status: AC
  Administered 2015-04-08: 2 g via INTRAVENOUS

## 2015-04-08 MED ORDER — PROPOFOL 10 MG/ML IV BOLUS
INTRAVENOUS | Status: DC | PRN
  Administered 2015-04-08: 200 mg via INTRAVENOUS

## 2015-04-08 MED ORDER — PROPOFOL 10 MG/ML IV BOLUS
INTRAVENOUS | Status: AC
  Filled 2015-04-08: qty 20

## 2015-04-08 MED ORDER — GLYCOPYRROLATE 0.2 MG/ML IJ SOLN
INTRAMUSCULAR | Status: AC
  Filled 2015-04-08: qty 4

## 2015-04-08 MED ORDER — KCL IN DEXTROSE-NACL 20-5-0.45 MEQ/L-%-% IV SOLN
INTRAVENOUS | Status: DC
  Administered 2015-04-08: 1000 mL via INTRAVENOUS
  Administered 2015-04-09 – 2015-04-12 (×8): via INTRAVENOUS
  Filled 2015-04-08 (×11): qty 1000

## 2015-04-08 MED ORDER — DIPHENHYDRAMINE HCL 50 MG/ML IJ SOLN: 12.5000 mg | Freq: Four times a day (QID) | INTRAMUSCULAR | Status: DC | PRN

## 2015-04-08 MED ORDER — DEXTROSE 5 % IV SOLN: 2.0000 g | INTRAVENOUS | Status: DC

## 2015-04-08 MED ORDER — ROCURONIUM BROMIDE 100 MG/10ML IV SOLN
INTRAVENOUS | Status: AC
  Filled 2015-04-08: qty 1

## 2015-04-08 MED ORDER — GLYCOPYRROLATE 0.2 MG/ML IJ SOLN
INTRAMUSCULAR | Status: DC | PRN
  Administered 2015-04-08: .6 mg via INTRAVENOUS

## 2015-04-08 MED ORDER — CEFOTETAN DISODIUM-DEXTROSE 2-2.08 GM-% IV SOLR
INTRAVENOUS | Status: AC
  Filled 2015-04-08: qty 50

## 2015-04-08 MED ORDER — ONDANSETRON HCL 4 MG/2ML IJ SOLN
INTRAMUSCULAR | Status: DC | PRN
  Administered 2015-04-08: 4 mg via INTRAVENOUS

## 2015-04-08 MED ORDER — 0.9 % SODIUM CHLORIDE (POUR BTL) OPTIME
TOPICAL | Status: DC | PRN
  Administered 2015-04-08: 3000 mL

## 2015-04-08 MED ORDER — SODIUM CHLORIDE 3 % IN NEBU
INHALATION_SOLUTION | RESPIRATORY_TRACT | Status: AC
  Filled 2015-04-08: qty 15

## 2015-04-08 MED ORDER — ALVIMOPAN 12 MG PO CAPS
12.0000 mg | ORAL_CAPSULE | Freq: Once | ORAL | Status: AC
  Administered 2015-04-08: 12 mg via ORAL
  Filled 2015-04-08: qty 1

## 2015-04-08 MED ORDER — MIDAZOLAM HCL 5 MG/5ML IJ SOLN
INTRAMUSCULAR | Status: DC | PRN
  Administered 2015-04-08: 2 mg via INTRAVENOUS

## 2015-04-08 MED ORDER — ALVIMOPAN 12 MG PO CAPS
12.0000 mg | ORAL_CAPSULE | Freq: Two times a day (BID) | ORAL | Status: DC
  Administered 2015-04-09 – 2015-04-11 (×5): 12 mg via ORAL
  Filled 2015-04-08 (×6): qty 1

## 2015-04-08 MED ORDER — ALUM & MAG HYDROXIDE-SIMETH 200-200-20 MG/5ML PO SUSP
30.0000 mL | Freq: Four times a day (QID) | ORAL | Status: DC | PRN
  Administered 2015-04-09 – 2015-04-11 (×5): 30 mL via ORAL
  Filled 2015-04-08 (×6): qty 30

## 2015-04-08 MED ORDER — PHENYLEPHRINE HCL 10 MG/ML IJ SOLN
INTRAMUSCULAR | Status: DC | PRN
  Administered 2015-04-08 (×2): 80 ug via INTRAVENOUS

## 2015-04-08 MED ORDER — LIDOCAINE HCL (CARDIAC) 20 MG/ML IV SOLN
INTRAVENOUS | Status: DC | PRN
  Administered 2015-04-08: 50 mg via INTRAVENOUS

## 2015-04-08 MED ORDER — ONDANSETRON HCL 4 MG PO TABS: 4.0000 mg | ORAL_TABLET | Freq: Four times a day (QID) | ORAL | Status: DC | PRN

## 2015-04-08 MED ORDER — DEXTROSE 5 % IV SOLN
10.0000 mg | INTRAVENOUS | Status: DC | PRN
  Administered 2015-04-08: 40 ug/min via INTRAVENOUS

## 2015-04-08 MED ORDER — ATORVASTATIN CALCIUM 20 MG PO TABS
20.0000 mg | ORAL_TABLET | Freq: Every day | ORAL | Status: DC
  Administered 2015-04-08 – 2015-04-12 (×5): 20 mg via ORAL
  Filled 2015-04-08 (×6): qty 1

## 2015-04-08 MED ORDER — LACTATED RINGERS IV SOLN
INTRAVENOUS | Status: DC
  Administered 2015-04-08: 16:00:00 via INTRAVENOUS
  Administered 2015-04-08: 1000 mL via INTRAVENOUS

## 2015-04-08 MED ORDER — HYDROMORPHONE HCL 1 MG/ML IJ SOLN: 0.2500 mg | INTRAMUSCULAR | Status: DC | PRN

## 2015-04-08 MED ORDER — METHYLENE BLUE 1 % INJ SOLN: INTRAMUSCULAR | Status: DC | PRN

## 2015-04-08 MED ORDER — ENOXAPARIN SODIUM 40 MG/0.4ML ~~LOC~~ SOLN
40.0000 mg | SUBCUTANEOUS | Status: DC
  Administered 2015-04-09 – 2015-04-12 (×4): 40 mg via SUBCUTANEOUS
  Filled 2015-04-08 (×6): qty 0.4

## 2015-04-08 MED ORDER — LACTATED RINGERS IR SOLN
Status: DC | PRN
  Administered 2015-04-08: 1000 mL

## 2015-04-08 MED ORDER — BUPIVACAINE-EPINEPHRINE (PF) 0.25% -1:200000 IJ SOLN
INTRAMUSCULAR | Status: AC
  Filled 2015-04-08: qty 30

## 2015-04-08 MED ORDER — PAROXETINE HCL 20 MG PO TABS
20.0000 mg | ORAL_TABLET | Freq: Every day | ORAL | Status: DC
  Administered 2015-04-09 – 2015-04-12 (×4): 20 mg via ORAL
  Filled 2015-04-08 (×5): qty 1

## 2015-04-08 MED ORDER — NEOSTIGMINE METHYLSULFATE 10 MG/10ML IV SOLN
INTRAVENOUS | Status: DC | PRN
  Administered 2015-04-08: 4 mg via INTRAVENOUS

## 2015-04-08 MED ORDER — METHYLENE BLUE 1 % INJ SOLN
INTRAMUSCULAR | Status: DC | PRN
  Administered 2015-04-08: 5 mL

## 2015-04-08 MED ORDER — PROMETHAZINE HCL 25 MG/ML IJ SOLN: 6.2500 mg | INTRAMUSCULAR | Status: DC | PRN

## 2015-04-08 MED ORDER — BUPIVACAINE-EPINEPHRINE 0.25% -1:200000 IJ SOLN
INTRAMUSCULAR | Status: DC | PRN
  Administered 2015-04-08: 14 mL

## 2015-04-08 MED ORDER — DEXTROSE 5 % IV SOLN
2.0000 g | Freq: Two times a day (BID) | INTRAVENOUS | Status: AC
  Administered 2015-04-09: 2 g via INTRAVENOUS
  Filled 2015-04-08: qty 2

## 2015-04-08 MED ORDER — ROCURONIUM BROMIDE 100 MG/10ML IV SOLN
INTRAVENOUS | Status: DC | PRN
  Administered 2015-04-08: 20 mg via INTRAVENOUS
  Administered 2015-04-08: 10 mg via INTRAVENOUS
  Administered 2015-04-08: 50 mg via INTRAVENOUS
  Administered 2015-04-08 (×2): 20 mg via INTRAVENOUS

## 2015-04-08 MED ORDER — VARENICLINE TARTRATE 0.5 MG X 11 & 1 MG X 42 PO MISC: 1.0000 | Freq: Two times a day (BID) | ORAL | Status: DC

## 2015-04-08 MED ORDER — HEPARIN SODIUM (PORCINE) 5000 UNIT/ML IJ SOLN
5000.0000 [IU] | Freq: Once | INTRAMUSCULAR | Status: AC
  Administered 2015-04-08: 5000 [IU] via SUBCUTANEOUS
  Filled 2015-04-08: qty 1

## 2015-04-08 SURGICAL SUPPLY — 92 items
BLADE EXTENDED COATED 6.5IN (ELECTRODE) IMPLANT
CANNULA REDUC XI 12-8 STAPL (CANNULA) ×1
CANNULA REDUC XI 12-8MM STAPL (CANNULA) ×1
CANNULA REDUCER 12-8 DVNC XI (CANNULA) ×1 IMPLANT
CATH FOLEY 3WAY 30CC 16FR (CATHETERS) ×3 IMPLANT
CELLS DAT CNTRL 66122 CELL SVR (MISCELLANEOUS) IMPLANT
CLIP LIGATING HEM O LOK PURPLE (MISCELLANEOUS) IMPLANT
CLIP LIGATING HEMOLOK MED (MISCELLANEOUS) IMPLANT
COUNTER NEEDLE 20 DBL MAG RED (NEEDLE) ×3 IMPLANT
COVER MAYO STAND STRL (DRAPES) ×6 IMPLANT
COVER SURGICAL LIGHT HANDLE (MISCELLANEOUS) IMPLANT
COVER TIP SHEARS 8 DVNC (MISCELLANEOUS) ×1 IMPLANT
COVER TIP SHEARS 8MM DA VINCI (MISCELLANEOUS) ×2
DECANTER SPIKE VIAL GLASS SM (MISCELLANEOUS) ×3 IMPLANT
DEVICE TROCAR PUNCTURE CLOSURE (ENDOMECHANICALS) IMPLANT
DRAPE ARM DVNC X/XI (DISPOSABLE) ×5 IMPLANT
DRAPE COLUMN DVNC XI (DISPOSABLE) ×1 IMPLANT
DRAPE DA VINCI XI ARM (DISPOSABLE) ×10
DRAPE DA VINCI XI COLUMN (DISPOSABLE) ×2
DRAPE SURG IRRIG POUCH 19X23 (DRAPES) ×3 IMPLANT
DRSG OPSITE POSTOP 4X10 (GAUZE/BANDAGES/DRESSINGS) IMPLANT
DRSG OPSITE POSTOP 4X6 (GAUZE/BANDAGES/DRESSINGS) ×3 IMPLANT
DRSG OPSITE POSTOP 4X8 (GAUZE/BANDAGES/DRESSINGS) IMPLANT
ELECT PENCIL ROCKER SW 15FT (MISCELLANEOUS) ×6 IMPLANT
ELECT REM PT RETURN 15FT ADLT (MISCELLANEOUS) ×3 IMPLANT
ENDOLOOP SUT PDS II  0 18 (SUTURE)
ENDOLOOP SUT PDS II 0 18 (SUTURE) IMPLANT
EVACUATOR SILICONE 100CC (DRAIN) IMPLANT
GAUZE SPONGE 4X4 12PLY STRL (GAUZE/BANDAGES/DRESSINGS) ×3 IMPLANT
GLOVE BIO SURGEON STRL SZ 6.5 (GLOVE) ×12 IMPLANT
GLOVE BIO SURGEONS STRL SZ 6.5 (GLOVE) ×6
GLOVE BIOGEL PI IND STRL 7.0 (GLOVE) ×6 IMPLANT
GLOVE BIOGEL PI INDICATOR 7.0 (GLOVE) ×12
GOWN STRL REUS W/TWL 2XL LVL3 (GOWN DISPOSABLE) ×12 IMPLANT
GOWN STRL REUS W/TWL XL LVL3 (GOWN DISPOSABLE) ×18 IMPLANT
HOLDER FOLEY CATH W/STRAP (MISCELLANEOUS) ×3 IMPLANT
LEGGING LITHOTOMY PAIR STRL (DRAPES) ×3 IMPLANT
LIQUID BAND (GAUZE/BANDAGES/DRESSINGS) ×3 IMPLANT
MARKER SKIN DUAL TIP RULER LAB (MISCELLANEOUS) ×3 IMPLANT
NEEDLE INSUFFLATION 14GA 120MM (NEEDLE) ×3 IMPLANT
PACK CARDIOVASCULAR III (CUSTOM PROCEDURE TRAY) ×3 IMPLANT
PACK COLON (CUSTOM PROCEDURE TRAY) ×3 IMPLANT
PORT LAP GEL ALEXIS MED 5-9CM (MISCELLANEOUS) ×3 IMPLANT
RTRCTR WOUND ALEXIS 18CM MED (MISCELLANEOUS)
SCISSORS LAP 5X35 DISP (ENDOMECHANICALS) ×3 IMPLANT
SEAL CANN UNIV 5-8 DVNC XI (MISCELLANEOUS) ×3 IMPLANT
SEAL XI 5MM-8MM UNIVERSAL (MISCELLANEOUS) ×6
SEALER VESSEL DA VINCI XI (MISCELLANEOUS) ×2
SEALER VESSEL EXT DVNC XI (MISCELLANEOUS) ×1 IMPLANT
SET IRRIG TUBING LAPAROSCOPIC (IRRIGATION / IRRIGATOR) ×3 IMPLANT
SLEEVE XCEL OPT CAN 5 100 (ENDOMECHANICALS) IMPLANT
SOLUTION ELECTROLUBE (MISCELLANEOUS) ×3 IMPLANT
STAPLER 45 BLU RELOAD XI (STAPLE) ×2 IMPLANT
STAPLER 45 BLUE RELOAD XI (STAPLE) ×4
STAPLER 45 GREEN RELOAD XI (STAPLE)
STAPLER 45 GRN RELOAD XI (STAPLE) IMPLANT
STAPLER CANNULA SEAL DVNC XI (STAPLE) ×1 IMPLANT
STAPLER CANNULA SEAL XI (STAPLE) ×2
STAPLER CIRC ILS CVD 33MM 37CM (STAPLE) ×3 IMPLANT
STAPLER SHEATH (SHEATH) ×2
STAPLER SHEATH ENDOWRIST DVNC (SHEATH) ×1 IMPLANT
STAPLER VISISTAT 35W (STAPLE) ×3 IMPLANT
SUT NOVA 1 T20/GS 25DT (SUTURE) ×6 IMPLANT
SUT PDS AB 1 CTX 36 (SUTURE) IMPLANT
SUT PDS AB 1 TP1 96 (SUTURE) IMPLANT
SUT PROLENE 2 0 KS (SUTURE) ×3 IMPLANT
SUT SILK 2 0 (SUTURE) ×2
SUT SILK 2 0 SH CR/8 (SUTURE) ×3 IMPLANT
SUT SILK 2-0 18XBRD TIE 12 (SUTURE) ×1 IMPLANT
SUT SILK 3 0 (SUTURE) ×2
SUT SILK 3 0 SH CR/8 (SUTURE) ×3 IMPLANT
SUT SILK 3-0 18XBRD TIE 12 (SUTURE) ×1 IMPLANT
SUT V-LOC BARB 180 2/0GR6 GS22 (SUTURE)
SUT VIC AB 2-0 SH 18 (SUTURE) ×3 IMPLANT
SUT VIC AB 2-0 SH 27 (SUTURE) ×2
SUT VIC AB 2-0 SH 27X BRD (SUTURE) ×1 IMPLANT
SUT VIC AB 3-0 SH 18 (SUTURE) IMPLANT
SUT VIC AB 4-0 PS2 18 (SUTURE) ×3 IMPLANT
SUT VIC AB 4-0 PS2 27 (SUTURE) ×6 IMPLANT
SUTURE V-LC BRB 180 2/0GR6GS22 (SUTURE) IMPLANT
SYRINGE 10CC LL (SYRINGE) ×3 IMPLANT
SYS LAPSCP GELPORT 120MM (MISCELLANEOUS)
SYSTEM LAPSCP GELPORT 120MM (MISCELLANEOUS) IMPLANT
TOWEL OR 17X26 10 PK STRL BLUE (TOWEL DISPOSABLE) ×6 IMPLANT
TOWEL OR NON WOVEN STRL DISP B (DISPOSABLE) ×6 IMPLANT
TRAY FOLEY W/METER SILVER 14FR (SET/KITS/TRAYS/PACK) IMPLANT
TRAY FOLEY W/METER SILVER 16FR (SET/KITS/TRAYS/PACK) ×3 IMPLANT
TROCAR BLADELESS OPT 5 100 (ENDOMECHANICALS) ×3 IMPLANT
TUBING CONNECTING 10 (TUBING) IMPLANT
TUBING CONNECTING 10' (TUBING)
TUBING FILTER THERMOFLATOR (ELECTROSURGICAL) ×3 IMPLANT
WATER STERILE IRR 500ML POUR (IV SOLUTION) ×3 IMPLANT

## 2015-04-08 NOTE — Transfer of Care (Signed)
Immediate Anesthesia Transfer of Care Note  Patient: Bobby Roth  Procedure(s) Performed: Procedure(s): XI ROBOT ASSISTED SIGMOIDECTOMY (N/A)  Patient Location: PACU  Anesthesia Type:General  Level of Consciousness:  sedated, patient cooperative and responds to stimulation  Airway & Oxygen Therapy:Patient Spontanous Breathing and Patient connected to face mask oxgen  Post-op Assessment:  Report given to PACU RN and Post -op Vital signs reviewed and stable  Post vital signs:  Reviewed and stable  Last Vitals:  Filed Vitals:   04/08/15 1036  BP: 149/98  Pulse: 66  Temp: 36.6 C  Resp: 18    Complications: No apparent anesthesia complications

## 2015-04-08 NOTE — Anesthesia Preprocedure Evaluation (Addendum)
Anesthesia Evaluation  Patient identified by MRN, date of birth, ID band Patient awake    Reviewed: Allergy & Precautions, NPO status , Patient's Chart, lab work & pertinent test results  Airway Mallampati: II  TM Distance: >3 FB Neck ROM: Full    Dental no notable dental hx.    Pulmonary neg pulmonary ROS, former smoker,    Pulmonary exam normal breath sounds clear to auscultation       Cardiovascular negative cardio ROS Normal cardiovascular exam Rhythm:Regular Rate:Normal     Neuro/Psych negative neurological ROS  negative psych ROS   GI/Hepatic GERD  Medicated,(+) Cirrhosis     substance abuse  alcohol use,   Endo/Other  negative endocrine ROS  Renal/GU negative Renal ROS  negative genitourinary   Musculoskeletal negative musculoskeletal ROS (+)   Abdominal   Peds negative pediatric ROS (+)  Hematology negative hematology ROS (+)   Anesthesia Other Findings   Reproductive/Obstetrics negative OB ROS                            Anesthesia Physical Anesthesia Plan  ASA: III  Anesthesia Plan: General   Post-op Pain Management:    Induction: Intravenous  Airway Management Planned: Oral ETT  Additional Equipment:   Intra-op Plan:   Post-operative Plan: Extubation in OR  Informed Consent: I have reviewed the patients History and Physical, chart, labs and discussed the procedure including the risks, benefits and alternatives for the proposed anesthesia with the patient or authorized representative who has indicated his/her understanding and acceptance.   Dental advisory given  Plan Discussed with: CRNA and Surgeon  Anesthesia Plan Comments:         Anesthesia Quick Evaluation

## 2015-04-08 NOTE — Op Note (Signed)
04/08/2015  6:47 PM  PATIENT:  Bobby Roth  55 y.o. male  Patient Care Team: Elizabeth Palau, FNP as PCP - General (Nurse Practitioner)  PRE-OPERATIVE DIAGNOSIS:  Diverticulitis with colovesicular fistula, h/o liver abscess  POST-OPERATIVE DIAGNOSIS:   Diverticulitis with colovesicular fistula, h/o liver abscess  PROCEDURE:   XI ROBOT ASSISTED SIGMOIDECTOMY  SURGEON:  Surgeon(s): Romie Levee, MD Almond Lint, MD  ASSISTANT: Dr Donell Beers   ANESTHESIA:   local and general  EBL:  Total I/O In: 2000 [I.V.:2000] Out: 250 [Urine:200; Blood:50]  Delay start of Pharmacological VTE agent (>24hrs) due to surgical blood loss or risk of bleeding:  no  DRAINS: none   SPECIMEN:  Source of Specimen:  Sigmoid colon  DISPOSITION OF SPECIMEN:  PATHOLOGY  COUNTS:  YES  PLAN OF CARE: Admit to inpatient   PATIENT DISPOSITION:  PACU - hemodynamically stable.  INDICATION:    55 y.o. M who presented to the hospital with liver abscess. He was placed on IV antibiotics and a drain was placed in his liver. A CT scan of his abdomen did confirm diverticulitis. Since that time the drain has been removed and he is off antibiotics. After coming off antibiotics he did develop some feculent drainage in his urine. He was placed back on antibodies at that time and his symptoms have improved.  I recommended segmental resection:  The anatomy & physiology of the digestive tract was discussed.  The pathophysiology was discussed.  Natural history risks without surgery was discussed.   I worked to give an overview of the disease and the frequent need to have multispecialty involvement.  I feel the risks of no intervention will lead to serious problems that outweigh the operative risks; therefore, I recommended a partial colectomy to remove the pathology.  Laparoscopic & open techniques were discussed.   Risks such as bleeding, infection, abscess, leak, reoperation, possible ostomy, hernia, heart attack,  death, and other risks were discussed.  I noted a good likelihood this will help address the problem.   Goals of post-operative recovery were discussed as well.    The patient expressed understanding & wished to proceed with surgery.  OR FINDINGS:   Patient had a diverticular abscess to the dome of the bladder.    The anastomosis rests 15 cm from the anal verge by rigid proctoscopy.  DESCRIPTION:   Informed consent was confirmed.  The patient underwent general anaesthesia without difficulty.  The patient was positioned appropriately.  VTE prevention in place.  The patient's abdomen was clipped, prepped, & draped in a sterile fashion.  Surgical timeout confirmed our plan.  The patient was positioned in reverse Trendelenburg.  Abdominal entry was gained using a varies needle in the left upper quadrant.  Entry was clean.  I induced carbon dioxide insufflation.  I placed a 8 mm port in the abdomen. Camera inspection revealed no injury at either site.  Extra ports were carefully placed under direct laparoscopic visualization.  I placed additional 8 mm ports in the right lower quadrant and mid abdomen. A 12 mm port was placed in the right lower quadrant. A 5 mm port was placed in the right upper quadrant.   I reflected the greater omentum and the upper abdomen the small bowel in the upper abdomen.  The robot was then docked to the patient's left side. Instruments were placed under direct visualization. I mobilized the sigmoid colon off of the left lateral sidewall using scissors. Once this was mobilized, I turned my attention to  the portion of the bladder that was adherent to the colon. I separated the colon from the abdominal wall using the robotic scissors. I then insufflated the bladder with methylene blue and saline. There was no leak noted in the bladder. The colon was completely mobile at this time. I scored the base of peritoneum of the right side of the mesentery of the left colon from the  peritoneal reflection of the mid rectum to just beyond the takeoff of the IMA.   I elevated the sigmoid mesentery and enetered into the retro-mesenteric plane. We were able to identify the left ureter and gonadal vessels. We kept those posterior within the retroperitoneum and elevated the left colon mesentery off that. I did isolated IMA pedicle but did not ligate it yet.  I continued distally and got into the avascular plane posterior to the mesorectum. This allowed me to help mobilize the rectum as well by freeing the mesorectum off the sacrum.  I mobilized the peritoneal coverings towards the peritoneal reflection on both the right and left sides of the rectum.  I could see the right and left ureters and stayed away from them.    I skeletonized the inferior mesenteric artery pedicle. After confirming the left ureter was out of the way, I went ahead and ligated the inferior mesenteric artery pedicle with the robotic vessel sealer well above its takeoff from the aorta.  We ensured hemostasis. I skeletonized the mesorectum at the junction at the proximal rectum using blunt dissection & the robotic vessel sealer.  I mobilized the left colon in a lateral to medial fashion off the line of Toldt up towards the splenic flexure to ensure good mobilization of the left colon to reach into the pelvis.  After this was complete we undocked the robot. I enlarged the 12 mm port slightly and placed a wound protector. Colon was removed from this and transected proximal to the area of inflammation. This was sent off for further examination by pathology. A pursestring device was placed on the proximal end of the colon. A 2-0 Prolene pursestring was created. This was secured with 3-0 silk sutures. A 33 mm EEA anvil was placed into the opening and the pursestring was tied tightly around this. This was then placed back into the abdomen. The EEA stapler was inserted into the rectum and brought out through the distal rectal stump.  The spike was brought out anterior to the staple line. An anastomosis was created without tension. There were 2 complete tissue donuts. There was no leak when tested underwater and insufflation. The remaining abdomen was irrigated with 2 L of normal saline. Hemostasis was good. The omentum was then brought down over the bowel. The abdomen was desufflated. The ports and when protector were removed. We switched to clean instruments, gowns, drapes and gloves. The fascia was then closed using interrupted 0 Novafil sutures. The subcutaneous tissue was closed using interrupted 2-0 Vicryl sutures. The skin of all port sites and the extraction site were all closed using 4-0 running Vicryl sutures. Dermabond was placed over the port sites and a dressing was placed over the extraction site. The patient was awakened from anesthesia and sent to the post anesthesia care unit in stable condition. All counts are correct per operating room staff.

## 2015-04-08 NOTE — H&P (Signed)
History of Present Illness Bobby Levee MD; 03/03/2015 10:34 AM) The patient is a 55 year old male who presents with diverticulitis. 55 year old male presents to the hospital with fevers and liver abscess. On evaluation of the patient's abdomen, a small pericolonic abscess could be noted at the dome of the bladder. To my knowledge this was treated with antibiotics alone and a drain was placed in the liver abscess. His fever subsided and he was discharged to home. Repeat CT scan shows improvement in the liver abscess and the diverticulitis. He denies any pneumaturia or dysuria. He does feel some pain at the top of his bladder when he urinates. He denies any fever or changes in his bowel habits. His drain has been removed and his liver abscess has resolved. He saw Dr. Orvan Falconer and it was felt that he most likely could come off of his antibiotics. He is doing well he is not losing weight and he has a good appetite.   Problem List/Past Medical Bobby Levee, MD; 03/03/2015 10:34 AM) DIVERTICULITIS OF LARGE INTESTINE WITH PERFORATION AND ABSCESS WITHOUT BLEEDING (K57.20) BACTERIAL LIVER ABSCESS (K75.0)  Other Problems Bobby Levee, MD; 03/03/2015 10:34 AM) Gastroesophageal Reflux Disease Diverticulosis Back Pain  Past Surgical History Bobby Levee, MD; 03/03/2015 10:34 AM) Tonsillectomy Colon Polyp Removal - Colonoscopy  Diagnostic Studies History Bobby Levee, MD; 03/03/2015 10:34 AM) Colonoscopy 1-5 years ago  Allergies Fay Records, CMA; 03/02/2015 4:32 PM) No Known Drug Allergies 01/26/2015  Medication History Bobby Levee, MD; 03/03/2015 10:34 AM) Amoxicillin (875MG  Tablet, Oral) Active. Lipitor (20MG  Tablet, Oral) Active. Vitamin D (2000UNIT Tablet, Oral) Active. PriLOSEC (20MG  Capsule DR, Oral) Active. Paxil (20MG  Tablet, Oral) Active. Aspirin (81MG  Tablet, Oral) Active. Medications Reconciled Chantix Starting Month Pak (0.5 MG X 11 &1 MG X 42 Tablet,  Oral) Active. Neomycin Sulfate (500MG  Tablet, 2 (two) Tablet Oral SEE NOTE, Taken starting 03/02/2015) Active. (TAKE TWO TABLETS AT 2 PM, 3 PM, AND 10 PM THE DAY PRIOR TO SURGERY) Flagyl (500MG  Tablet, 2 (two) Tablet Oral SEE NOTE, Taken starting 03/02/2015) Active. (Take at 2pm, 3pm, and 10pm the day prior to your colon operation)  Social History Bobby Levee, MD; 03/03/2015 10:34 AM) Alcohol use Moderate alcohol use. Tobacco use Current every day smoker. Caffeine use Carbonated beverages, Coffee. No drug use  Family History Bobby Levee, MD; 03/03/2015 10:34 AM) Seizure disorder Brother. Migraine Headache Brother. Alcohol Abuse Father. Colon Cancer Family Members In General. Heart Disease Father. Heart disease in male family member before age 54 Colon Polyps Brother. Depression Brother.    Review of Systems - General ROS: negative Respiratory ROS: no cough, shortness of breath, or wheezing Cardiovascular ROS: no chest pain or dyspnea on exertion Gastrointestinal ROS: no abdominal pain, change in bowel habits, or black or bloody stools Genito-Urinary ROS: positive for - dysuria, hematuria and pelvic pain negative for - air in urine  BP 149/98 mmHg  Pulse 66  Temp(Src) 97.8 F (36.6 C)  Resp 18  Ht 5\' 9"  (1.753 m)  Wt 87.998 kg (194 lb)  BMI 28.64 kg/m2  SpO2 99%      Physical Exam   General Mental Status-Alert. General Appearance-Not in acute distress. Build & Nutrition-Well nourished. Posture-Normal posture. Gait-Normal.  Head and Neck Head-normocephalic, atraumatic with no lesions or palpable masses. Trachea-midline.  Chest and Lung Exam Chest and lung exam reveals -on auscultation, normal breath sounds, no adventitious sounds and normal vocal resonance.  Cardiovascular Cardiovascular examination reveals -normal heart sounds, regular rate and rhythm with no murmurs.  Abdomen Inspection Inspection of the abdomen  reveals - No Hernias. Palpation/Percussion Palpation and Percussion of the abdomen reveal - Soft, No Rigidity (guarding) and No Palpable abdominal masses. Note: Slightly tender to palpation in the suprapubic region.  Neurologic Neurologic evaluation reveals -alert and oriented x 3 with no impairment of recent or remote memory, normal attention span and ability to concentrate, normal sensation and normal coordination.  Musculoskeletal Normal Exam - Bilateral-Upper Extremity Strength Normal and Lower Extremity Strength Normal.    Assessment & Plan Bobby Roth(Jeyli Zwicker MD; 03/03/2015 10:32 AM)  DIVERTICULITIS OF LARGE INTESTINE WITH PERFORATION AND ABSCESS WITHOUT BLEEDING (K57.20) Impression: Patient's liver abscess has resolved. He continues to have some pain above his bladder with urination. He denies any fever or changes in his bowel habits. He is currently still on his antibiotics. I recommended that he stop the antibiotics and call the office if his symptoms worsened after that. If that occurs, we will have him continue antibiotics until surgery. I have recommended that he have surgery in early January at the earliest. We will go ahead and get this scheduled. In the meantime he will need a colonoscopy. [facility] We discussed the surgery in detail. I have recommended a robotic approach. The surgery and anatomy were described to the patient as well as the risks of surgery and the possible complications. These include: Bleeding, deep abdominal infections and possible wound complications such as hernia and infection, damage to adjacent structures, leak of surgical connections, which can lead to other surgeries and possibly an ostomy, possible need for other procedures, such as abscess drains in radiology, possible prolonged hospital stay, possible diarrhea from removal of part of the colon, possible constipation from narcotics, possible bowel, bladder  or sexual dysfunction if having rectal surgery, prolonged fatigue/weakness or appetite loss, possible early recurrence of of disease, possible complications of their medical problems such as heart disease or arrhythmias or lung problems, death (less than 1%). I believe the patient understands and wishes to proceed with the surgery.

## 2015-04-08 NOTE — Anesthesia Postprocedure Evaluation (Signed)
Anesthesia Post Note  Patient: Bobby Roth  Procedure(s) Performed: Procedure(s) (LRB): XI ROBOT ASSISTED SIGMOIDECTOMY (N/A)  Patient location during evaluation: PACU Anesthesia Type: General Level of consciousness: awake and alert Pain management: pain level controlled Vital Signs Assessment: post-procedure vital signs reviewed and stable Respiratory status: spontaneous breathing, nonlabored ventilation, respiratory function stable and patient connected to nasal cannula oxygen Cardiovascular status: blood pressure returned to baseline and stable Postop Assessment: no signs of nausea or vomiting Anesthetic complications: no    Last Vitals:  Filed Vitals:   04/08/15 1830 04/08/15 1845  BP: 128/81 130/88  Pulse: 70 81  Temp: 36.9 C   Resp: 12 10    Last Pain:  Filed Vitals:   04/08/15 1850  PainSc: 0-No pain                 Isabele Lollar S

## 2015-04-08 NOTE — Progress Notes (Signed)
5 W notified pt will be 1540 at [id number].  Needs bed and IV pump.

## 2015-04-08 NOTE — Anesthesia Procedure Notes (Signed)
Procedure Name: Intubation Date/Time: 04/08/2015 2:50 PM Performed by: Paris LoreBLANTON, Lonnette Shrode M Pre-anesthesia Checklist: Patient identified, Emergency Drugs available, Suction available, Patient being monitored and Timeout performed Patient Re-evaluated:Patient Re-evaluated prior to inductionOxygen Delivery Method: Circle system utilized Preoxygenation: Pre-oxygenation with 100% oxygen Intubation Type: IV induction Ventilation: Mask ventilation without difficulty Laryngoscope Size: Mac and 4 Grade View: Grade I Tube type: Oral Tube size: 7.5 mm Number of attempts: 1 Airway Equipment and Method: Stylet Placement Confirmation: ETT inserted through vocal cords under direct vision,  positive ETCO2,  CO2 detector and breath sounds checked- equal and bilateral Secured at: 23 cm Tube secured with: Tape Dental Injury: Teeth and Oropharynx as per pre-operative assessment

## 2015-04-09 LAB — BASIC METABOLIC PANEL
ANION GAP: 9 (ref 5–15)
BUN: 15 mg/dL (ref 6–20)
CALCIUM: 9.2 mg/dL (ref 8.9–10.3)
CHLORIDE: 103 mmol/L (ref 101–111)
CO2: 26 mmol/L (ref 22–32)
Creatinine, Ser: 1.3 mg/dL — ABNORMAL HIGH (ref 0.61–1.24)
GFR calc non Af Amer: >60 mL/min (ref >60)
GLUCOSE: 161 mg/dL — AB (ref 65–99)
POTASSIUM: 4.7 mmol/L (ref 3.5–5.1)
Sodium: 138 mmol/L (ref 135–145)

## 2015-04-09 LAB — CBC
HEMATOCRIT: 40.8 % (ref 39.0–52.0)
HEMOGLOBIN: 13.3 g/dL (ref 13.0–17.0)
MCH: 30.9 pg (ref 26.0–34.0)
MCHC: 32.6 g/dL (ref 30.0–36.0)
MCV: 94.9 fL (ref 78.0–100.0)
Platelets: 186 10*3/uL (ref 150–400)
RBC: 4.3 MIL/uL (ref 4.22–5.81)
RDW: 13.8 % (ref 11.5–15.5)
WBC: 8.5 10*3/uL (ref 4.0–10.5)

## 2015-04-09 MED ORDER — CETYLPYRIDINIUM CHLORIDE 0.05 % MT LIQD
7.0000 mL | Freq: Two times a day (BID) | OROMUCOSAL | Status: DC
  Administered 2015-04-11 (×2): 7 mL via OROMUCOSAL

## 2015-04-09 MED ORDER — CHLORHEXIDINE GLUCONATE 0.12 % MT SOLN
15.0000 mL | Freq: Two times a day (BID) | OROMUCOSAL | Status: DC
  Administered 2015-04-09 – 2015-04-12 (×7): 15 mL via OROMUCOSAL
  Filled 2015-04-09 (×10): qty 15

## 2015-04-09 NOTE — Plan of Care (Signed)
Problem: Activity: Goal: Ability to return to normal activity level will improve Outcome: Progressing Patient sat on side of bed for 5 minutes.

## 2015-04-09 NOTE — Progress Notes (Signed)
1 Day Post-Op Robotic Sigmoidectomy Subjective: Doing ok.  C/O R eye pain but better from last night.  C/O foley irritation.  UOP good.  Objective: Vital signs in last 24 hours: Temp:  [97.8 F (36.6 C)-98.4 F (36.9 C)] 98 F (36.7 C) (01/12 0600) Pulse Rate:  [66-105] 82 (01/12 0600) Resp:  [10-18] 16 (01/12 0600) BP: (123-149)/(74-98) 126/80 mmHg (01/12 0600) SpO2:  [94 %-100 %] 99 % (01/12 0600) Weight:  [87.998 kg (194 lb)] 87.998 kg (194 lb) (01/11 1057)   Intake/Output from previous day: 01/11 0701 - 01/12 0700 In: 2696 [P.O.:60; I.V.:2636] Out: 1620 [Urine:1570; Blood:50] Intake/Output this shift:     General appearance: alert and cooperative GI: normal findings: soft, appropriately tender  Incision: no significant drainage, no significant erythema  Lab Results:   Recent Labs  04/09/15 0437  WBC 8.5  HGB 13.3  HCT 40.8  PLT 186   BMET  Recent Labs  04/09/15 0437  NA 138  K 4.7  CL 103  CO2 26  GLUCOSE 161*  BUN 15  CREATININE 1.30*  CALCIUM 9.2   PT/INR No results for input(s): LABPROT, INR in the last 72 hours. ABG No results for input(s): PHART, HCO3 in the last 72 hours.  Invalid input(s): PCO2, PO2  MEDS, Scheduled . acetaminophen  1,000 mg Oral 4 times per day  . alvimopan  12 mg Oral BID  . antiseptic oral rinse  7 mL Mouth Rinse q12n4p  . atorvastatin  20 mg Oral QHS  . chlorhexidine  15 mL Mouth Rinse BID  . enoxaparin (LOVENOX) injection  40 mg Subcutaneous Q24H  . pantoprazole  40 mg Oral Daily  . PARoxetine  20 mg Oral Daily    Studies/Results: No results found.  Assessment: s/p Procedure(s): XI ROBOT ASSISTED SIGMOIDECTOMY Patient Active Problem List   Diagnosis Date Noted  . 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 Acid reflux 02/17/2015  . Hepatic abscess   . Colo-vesical fistula   . Diverticulitis of large intestine with abscess without bleeding   . Sepsis (HCC)   . Alcoholic cirrhosis of liver without ascites (HCC)    . Alcoholic hepatitis without ascites   . Alcohol abuse   . Diverticulitis 01/07/2015  . Colonic diverticular abscess 01/07/2015  . Liver abscess 01/07/2015  . Difficult or painful urination 11/17/2014  . 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 Triggering of digit 03/13/2014  . Current tobacco use 04/04/2013  . HLD (hyperlipidemia) 04/04/2013  . Ejaculates too soon 12/28/2011    Expected post op course  Plan: d/c foley but follow uop closely, given Cr elevated.  Cont IVF's Start CLD Ambulate   LOS: 1 day     .Vanita PandaAlicia C Daphyne Miguez, MD [facility], [place] [phone]   04/09/2015 7:26 AM

## 2015-04-09 NOTE — Plan of Care (Signed)
Problem: Respiratory: Goal: Ability to achieve and maintain a regular respiratory rate will improve Outcome: Progressing Patient demonstrated ability to use IS

## 2015-04-09 NOTE — Progress Notes (Signed)
Nutrition Brief Note  Patient identified on the Malnutrition Screening Tool (MST) Report  Pt with recent weight gain. Per H&P, pt with good appetite.Will monitor for changes in appetite and PO intake.  Wt Readings from Last 15 Encounters:  04/08/15 194 lb (87.998 kg)  04/03/15 194 lb (87.998 kg)  02/26/15 180 lb (81.647 kg)  01/29/15 185 lb 8 oz (84.142 kg)  01/07/15 181 lb 7 oz (82.3 kg)    Body mass index is 28.64 kg/(m^2). Patient meets criteria for overweight based on current BMI.   Current diet order is clear liquids. Labs and medications reviewed.   No nutrition interventions warranted at this time. If nutrition issues arise, please consult RD.   Tilda FrancoLindsey Pietro Bonura, MS, RD, LDN Pager: [phone] After Hours Pager: [phone]

## 2015-04-10 LAB — BASIC METABOLIC PANEL
Anion gap: 7 (ref 5–15)
BUN: 10 mg/dL (ref 6–20)
CALCIUM: 9.3 mg/dL (ref 8.9–10.3)
CHLORIDE: 105 mmol/L (ref 101–111)
CO2: 29 mmol/L (ref 22–32)
CREATININE: 0.99 mg/dL (ref 0.61–1.24)
GFR calc Af Amer: >60 mL/min (ref >60)
GFR calc non Af Amer: >60 mL/min (ref >60)
Glucose, Bld: 127 mg/dL — ABNORMAL HIGH (ref 65–99)
Potassium: 4.4 mmol/L (ref 3.5–5.1)
SODIUM: 141 mmol/L (ref 135–145)

## 2015-04-10 LAB — CBC
HCT: 37.7 % — ABNORMAL LOW (ref 39.0–52.0)
Hemoglobin: 12.4 g/dL — ABNORMAL LOW (ref 13.0–17.0)
MCH: 31.6 pg (ref 26.0–34.0)
MCHC: 32.9 g/dL (ref 30.0–36.0)
MCV: 95.9 fL (ref 78.0–100.0)
PLATELETS: 151 10*3/uL (ref 150–400)
RBC: 3.93 MIL/uL — ABNORMAL LOW (ref 4.22–5.81)
RDW: 13.7 % (ref 11.5–15.5)
WBC: 7.1 10*3/uL (ref 4.0–10.5)

## 2015-04-10 NOTE — Progress Notes (Signed)
2 Days Post-Op Robotic Sigmoidectomy Subjective: Doing ok.  Feeling bloated this am.  No flatus yet  Objective: Vital signs in last 24 hours: Temp:  [98.2 F (36.8 C)-98.9 F (37.2 C)] 98.3 F (36.8 C) (01/13 0615) Pulse Rate:  [73-87] 76 (01/13 0615) Resp:  [16-18] 16 (01/13 0615) BP: (127-137)/(62-78) 127/73 mmHg (01/13 0615) SpO2:  [93 %-99 %] 93 % (01/13 0615)   Intake/Output from previous day: 01/12 0701 - 01/13 0700 In: 2619.6 [I.V.:2619.6] Out: 4550 [Urine:4550] Intake/Output this shift:     General appearance: alert and cooperative GI: normal findings: soft, appropriately tender Non-distended Incision: no significant drainage, no significant erythema  Lab Results:   Recent Labs  04/09/15 0437 04/10/15 0540  WBC 8.5 7.1  HGB 13.3 12.4*  HCT 40.8 37.7*  PLT 186 151   BMET  Recent Labs  04/09/15 0437 04/10/15 0540  NA 138 141  K 4.7 4.4  CL 103 105  CO2 26 29  GLUCOSE 161* 127*  BUN 15 10  CREATININE 1.30* 0.99  CALCIUM 9.2 9.3   PT/INR No results for input(s): LABPROT, INR in the last 72 hours. ABG No results for input(s): PHART, HCO3 in the last 72 hours.  Invalid input(s): PCO2, PO2  MEDS, Scheduled . alvimopan  12 mg Oral BID  . antiseptic oral rinse  7 mL Mouth Rinse q12n4p  . atorvastatin  20 mg Oral QHS  . chlorhexidine  15 mL Mouth Rinse BID  . enoxaparin (LOVENOX) injection  40 mg Subcutaneous Q24H  . pantoprazole  40 mg Oral Daily  . PARoxetine  20 mg Oral Daily    Studies/Results: No results found.  Assessment: s/p Procedure(s): XI ROBOT ASSISTED SIGMOIDECTOMY Patient Active Problem List   Diagnosis Date Noted  . 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 Acid reflux 02/17/2015  . Hepatic abscess   . Colo-vesical fistula   . Diverticulitis of large intestine with abscess without bleeding   . Sepsis (HCC)   . Alcoholic cirrhosis of liver without ascites (HCC)   . Alcoholic hepatitis without ascites   . Alcohol abuse   .  Diverticulitis 01/07/2015  . Colonic diverticular abscess 01/07/2015  . Liver abscess 01/07/2015  . Difficult or painful urination 11/17/2014  . 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 Triggering of digit 03/13/2014  . Current tobacco use 04/04/2013  . HLD (hyperlipidemia) 04/04/2013  . Ejaculates too soon 12/28/2011    Expected post op course  Plan: UOP excellent, Cr back to normal.  Cont IVF's until able to tolerate clears better Sips of CLD until flatus Cont to Ambulate   LOS: 2 days     .Vanita PandaAlicia C Lory Nowaczyk, MD [facility], [place] [phone]   04/10/2015 7:31 AM

## 2015-04-10 NOTE — Addendum Note (Signed)
Addendum  created 04/10/15 1414 by Eilene GhaziGeorge Darina Hartwell, MD   Modules edited: Orders, PRL Based Order Sets

## 2015-04-11 LAB — CBC
HCT: 37.6 % — ABNORMAL LOW (ref 39.0–52.0)
Hemoglobin: 12.2 g/dL — ABNORMAL LOW (ref 13.0–17.0)
MCH: 31.1 pg (ref 26.0–34.0)
MCHC: 32.4 g/dL (ref 30.0–36.0)
MCV: 95.9 fL (ref 78.0–100.0)
PLATELETS: 149 10*3/uL — AB (ref 150–400)
RBC: 3.92 MIL/uL — AB (ref 4.22–5.81)
RDW: 13.5 % (ref 11.5–15.5)
WBC: 6.7 10*3/uL (ref 4.0–10.5)

## 2015-04-11 LAB — BASIC METABOLIC PANEL
Anion gap: 8 (ref 5–15)
BUN: 9 mg/dL (ref 6–20)
CALCIUM: 9.6 mg/dL (ref 8.9–10.3)
CHLORIDE: 105 mmol/L (ref 101–111)
CO2: 30 mmol/L (ref 22–32)
CREATININE: 1.08 mg/dL (ref 0.61–1.24)
GFR calc non Af Amer: >60 mL/min (ref >60)
Glucose, Bld: 119 mg/dL — ABNORMAL HIGH (ref 65–99)
Potassium: 4.9 mmol/L (ref 3.5–5.1)
SODIUM: 143 mmol/L (ref 135–145)

## 2015-04-11 NOTE — Progress Notes (Signed)
Patient ID: Bobby Roth, male   DOB: 01/30/1961, 55 y.o.   MRN: [id number] [facility] Progress Note:   3 Days Post-Op  Subjective: Mental status is clear.  Concerned that he has not passed gas Objective: Vital signs in last 24 hours: Temp:  [98.2 F (36.8 C)-99 F (37.2 C)] 98.2 F (36.8 C) (01/14 0530) Pulse Rate:  [58-83] 58 (01/14 0530) Resp:  [16-18] 18 (01/14 0530) BP: (127-143)/(87-90) 132/87 mmHg (01/14 0530) SpO2:  [95 %-98 %] 96 % (01/14 0530)  Intake/Output from previous day: 01/13 0701 - 01/14 0700 In: 1961.3 [P.O.:120; I.V.:1841.3] Out: 3725 [Urine:3725] Intake/Output this shift: Total I/O In: 240 [P.O.:240] Out: 200 [Urine:200]  Physical Exam: Work of breathing is normal.  Incisions covered and minimal pain  Lab Results:  Results for orders placed or performed during the hospital encounter of 04/08/15 (from the past 48 hour(s))  Basic metabolic panel     Status: Abnormal   Collection Time: 04/10/15  5:40 AM  Result Value Ref Range   Sodium 141 135 - 145 mmol/L   Potassium 4.4 3.5 - 5.1 mmol/L   Chloride 105 101 - 111 mmol/L   CO2 29 22 - 32 mmol/L   Glucose, Bld 127 (H) 65 - 99 mg/dL   BUN 10 6 - 20 mg/dL   Creatinine, Ser 0.99 0.61 - 1.24 mg/dL   Calcium 9.3 8.9 - 10.3 mg/dL   GFR calc non Af Amer >60 >60 mL/min   GFR calc Af Amer >60 >60 mL/min    Comment: (NOTE) The eGFR has been calculated using the CKD EPI equation. This calculation has not been validated in all clinical situations. eGFR's persistently <60 mL/min signify possible Chronic Kidney Disease.    Anion gap 7 5 - 15  CBC     Status: Abnormal   Collection Time: 04/10/15  5:40 AM  Result Value Ref Range   WBC 7.1 4.0 - 10.5 K/uL   RBC 3.93 (L) 4.22 - 5.81 MIL/uL   Hemoglobin 12.4 (L) 13.0 - 17.0 g/dL   HCT 37.7 (L) 39.0 - 52.0 %   MCV 95.9 78.0 - 100.0 fL   MCH 31.6 26.0 - 34.0 pg   MCHC 32.9 30.0 - 36.0 g/dL   RDW 13.7 11.5 - 15.5 %   Platelets 151 150 - 400 K/uL   Basic metabolic panel     Status: Abnormal   Collection Time: 04/11/15  6:28 AM  Result Value Ref Range   Sodium 143 135 - 145 mmol/L   Potassium 4.9 3.5 - 5.1 mmol/L   Chloride 105 101 - 111 mmol/L   CO2 30 22 - 32 mmol/L   Glucose, Bld 119 (H) 65 - 99 mg/dL   BUN 9 6 - 20 mg/dL   Creatinine, Ser 1.08 0.61 - 1.24 mg/dL   Calcium 9.6 8.9 - 10.3 mg/dL   GFR calc non Af Amer >60 >60 mL/min   GFR calc Af Amer >60 >60 mL/min    Comment: (NOTE) The eGFR has been calculated using the CKD EPI equation. This calculation has not been validated in all clinical situations. eGFR's persistently <60 mL/min signify possible Chronic Kidney Disease.    Anion gap 8 5 - 15  CBC     Status: Abnormal   Collection Time: 04/11/15  6:28 AM  Result Value Ref Range   WBC 6.7 4.0 - 10.5 K/uL   RBC 3.92 (L) 4.22 - 5.81 MIL/uL   Hemoglobin 12.2 (L) 13.0 - 17.0  g/dL   HCT 37.6 (L) 39.0 - 52.0 %   MCV 95.9 78.0 - 100.0 fL   MCH 31.1 26.0 - 34.0 pg   MCHC 32.4 30.0 - 36.0 g/dL   RDW 13.5 11.5 - 15.5 %   Platelets 149 (L) 150 - 400 K/uL    Radiology/Results: No results found.  Anti-infectives: Anti-infectives    Start     Dose/Rate Route Frequency Ordered Stop   04/09/15 0200  cefoTEtan (CEFOTAN) 2 g in dextrose 5 % 50 mL IVPB     2 g 100 mL/hr over 30 Minutes Intravenous Every 12 hours 04/08/15 1942 04/09/15 0256   04/08/15 1115  cefoTEtan in Dextrose 5% (CEFOTAN) IVPB 2 g     2 g Intravenous On call to O.R. 04/08/15 1101 04/08/15 1522   04/08/15 1035  cefoTEtan (CEFOTAN) 2 g in dextrose 5 % 50 mL IVPB  Status:  Discontinued     2 g 100 mL/hr over 30 Minutes Intravenous On call to O.R. 04/08/15 1035 04/08/15 1101      Assessment/Plan: Problem List: Patient Active Problem List   Diagnosis Date Noted  . 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 Acid reflux 02/17/2015  . Hepatic abscess   . Colo-vesical fistula   . Diverticulitis of large intestine with abscess without bleeding   . Sepsis ([place])    . Alcoholic cirrhosis of liver without ascites ([place])   . Alcoholic hepatitis without ascites   . Alcohol abuse   . Diverticulitis 01/07/2015  . Colonic diverticular abscess 01/07/2015  . Liver abscess 01/07/2015  . Difficult or painful urination 11/17/2014  . 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 Triggering of digit 03/13/2014  . Current tobacco use 04/04/2013  . HLD (hyperlipidemia) 04/04/2013  . Ejaculates too soon 12/28/2011    Continue liquids.  Advance more rapidly after passing flatus 3 Days Post-Op    LOS: 3 days   Matt B. Hassell Done, MD, [facility] [phone] beeper [phone]  04/11/2015 10:57 AM

## 2015-04-12 NOTE — Progress Notes (Signed)
Patient ID: Bobby Roth, male   DOB: 08/22/1960, 55 y.o.   MRN: [id number] [facility] Progress Note:   4 Days Post-Op  Subjective: Mental status is clear.  Patient is without complaints Objective: Vital signs in last 24 hours: Temp:  [98.2 F (36.8 C)-98.7 F (37.1 C)] 98.2 F (36.8 C) (01/15 0515) Pulse Rate:  [60-73] 61 (01/15 0515) Resp:  [16-20] 16 (01/15 0515) BP: (123-137)/(77-86) 123/77 mmHg (01/15 0515) SpO2:  [97 %-99 %] 97 % (01/15 0515)  Intake/Output from previous day: 01/14 0701 - 01/15 0700 In: 1920 [P.O.:480; I.V.:1440] Out: 2925 [Urine:2925] Intake/Output this shift:    Physical Exam: Work of breathing is normal.  Has begun passing gas and had a small bm.    Lab Results:  Results for orders placed or performed during the hospital encounter of 04/08/15 (from the past 48 hour(s))  Basic metabolic panel     Status: Abnormal   Collection Time: 04/11/15  6:28 AM  Result Value Ref Range   Sodium 143 135 - 145 mmol/L   Potassium 4.9 3.5 - 5.1 mmol/L   Chloride 105 101 - 111 mmol/L   CO2 30 22 - 32 mmol/L   Glucose, Bld 119 (H) 65 - 99 mg/dL   BUN 9 6 - 20 mg/dL   Creatinine, Ser 1.08 0.61 - 1.24 mg/dL   Calcium 9.6 8.9 - 10.3 mg/dL   GFR calc non Af Amer >60 >60 mL/min   GFR calc Af Amer >60 >60 mL/min    Comment: (NOTE) The eGFR has been calculated using the CKD EPI equation. This calculation has not been validated in all clinical situations. eGFR's persistently <60 mL/min signify possible Chronic Kidney Disease.    Anion gap 8 5 - 15  CBC     Status: Abnormal   Collection Time: 04/11/15  6:28 AM  Result Value Ref Range   WBC 6.7 4.0 - 10.5 K/uL   RBC 3.92 (L) 4.22 - 5.81 MIL/uL   Hemoglobin 12.2 (L) 13.0 - 17.0 g/dL   HCT 37.6 (L) 39.0 - 52.0 %   MCV 95.9 78.0 - 100.0 fL   MCH 31.1 26.0 - 34.0 pg   MCHC 32.4 30.0 - 36.0 g/dL   RDW 13.5 11.5 - 15.5 %   Platelets 149 (L) 150 - 400 K/uL    Radiology/Results: No results  found.  Anti-infectives: Anti-infectives    Start     Dose/Rate Route Frequency Ordered Stop   04/09/15 0200  cefoTEtan (CEFOTAN) 2 g in dextrose 5 % 50 mL IVPB     2 g 100 mL/hr over 30 Minutes Intravenous Every 12 hours 04/08/15 1942 04/09/15 0256   04/08/15 1115  cefoTEtan in Dextrose 5% (CEFOTAN) IVPB 2 g     2 g Intravenous On call to O.R. 04/08/15 1101 04/08/15 1522   04/08/15 1035  cefoTEtan (CEFOTAN) 2 g in dextrose 5 % 50 mL IVPB  Status:  Discontinued     2 g 100 mL/hr over 30 Minutes Intravenous On call to O.R. 04/08/15 1035 04/08/15 1101      Assessment/Plan: Problem List: Patient Active Problem List   Diagnosis Date Noted  . 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 Acid reflux 02/17/2015  . Hepatic abscess   . Colo-vesical fistula   . Diverticulitis of large intestine with abscess without bleeding   . Sepsis (HCC)   . Alcoholic cirrhosis of liver without ascites (HCC)   . Alcoholic hepatitis without ascites   . Alcohol abuse   .   Diverticulitis 01/07/2015  . Colonic diverticular abscess 01/07/2015  . Liver abscess 01/07/2015  . Difficult or painful urination 11/17/2014  . 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 Triggering of digit 03/13/2014  . Current tobacco use 04/04/2013  . HLD (hyperlipidemia) 04/04/2013  . Ejaculates too soon 12/28/2011    Advance diet.  Hopeful discharge tomorrow.  4 Days Post-Op    LOS: 4 days   Matt B. Martin, MD, FACS  [facility] [phone] beeper [phone]  04/12/2015 9:30 AM  

## 2015-04-13 MED ORDER — HYDROCODONE-ACETAMINOPHEN 5-325 MG PO TABS: 1.0000 | ORAL_TABLET | ORAL | Status: DC | PRN

## 2015-04-13 MED ORDER — HYDROCODONE-ACETAMINOPHEN 5-325 MG PO TABS: 1.0000 | ORAL_TABLET | ORAL | Status: AC | PRN

## 2015-04-13 NOTE — Discharge Summary (Signed)
Physician Discharge Summary  Patient ID: Morris Blasndres von Vajna MRN: [id number] DOB/AGE: 54/11/1960 54 y.o.  Admit date: 04/08/2015 Discharge date: 04/13/2015  Admission Diagnoses:  Discharge Diagnoses:  Active Problems:   Colovesical fistula   Discharged Condition: good  Hospital Course: Patient tolerated surgery well.  His diet was advanced as tolerated.  He began to have bowel function on POD 4 and was discharged on POD 5.  His pain was controlled.    Consults: None  Significant Diagnostic Studies: labs: cbc, chem  Treatments: IV hydration, analgesia: Vicodin and surgery: see above  Discharge Exam: Blood pressure 114/70, pulse 63, temperature 98.4 F (36.9 C), temperature source Oral, resp. rate 18, height 5\' 9"  (1.753 m), weight 87.998 kg (194 lb), SpO2 99 %. General appearance: alert and cooperative GI: normal findings: soft, non-tender Incision/Wound: clean, dry, intact  Disposition: 06-Home-Health Care Svc     Medication List    STOP taking these medications        amoxicillin-clavulanate 875-125 MG tablet  Commonly known as:  AUGMENTIN      TAKE these medications        aspirin EC 81 MG tablet  Take 81 mg by mouth daily.     atorvastatin 20 MG tablet  Commonly known as:  LIPITOR  Take 20 mg by mouth at bedtime.     HYDROcodone-acetaminophen 5-325 MG tablet  Commonly known as:  NORCO/VICODIN  Take 1-2 tablets by mouth every 4 (four) hours as needed.     omeprazole 20 MG capsule  Commonly known as:  PRILOSEC  Take 20 mg by mouth daily.     PARoxetine 20 MG tablet  Commonly known as:  PAXIL  Take 20 mg by mouth daily.     varenicline 0.5 MG X 11 & 1 MG X 42 tablet  Commonly known as:  CHANTIX PAK  Take 1 mg by mouth 2 (two) times daily.     Vitamin D 2000 units tablet  Take 2,000 Units by mouth daily.           Follow-up Information    Follow up with Vanita PandaHOMAS, Dajanee Voorheis C., MD.   Specialty:  General Surgery   Why:  as scheduled   Contact  information:   [address] [place] [phone]       Signed: Vanita PandaHOMAS, Zanyia Silbaugh C. 04/13/2015, 8:32 AM

## 2015-04-13 NOTE — Discharge Instructions (Signed)

## 2017-07-29 IMAGING — CT CT ABDOMEN W/ CM
3 of 4 series · 12 of 36 positions shown, 18 images · IV contrast (iopamidol)
Comparison: 01/22/2015

CLINICAL DATA: Follow-up hepatic abscess. Patient has a
percutaneous drainage catheter.

EXAM:
CT ABDOMEN WITH CONTRAST
TECHNIQUE: Multidetector CT imaging of the abdomen was performed using the
standard protocol following bolus administration of intravenous
contrast.
CONTRAST:  100mL F7KCWV-DMM IOPAMIDOL (F7KCWV-DMM) INJECTION 61%

[Series 3: abdomen with · axial · 0.74mm/px · z∈[-215,+15]mm · 6 of 66 slices shown, 11 images]
[im 10/66  soft-tissue]
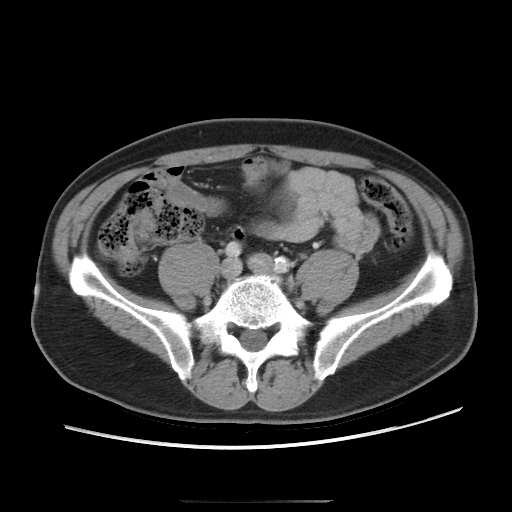
[im 10/66  bone]
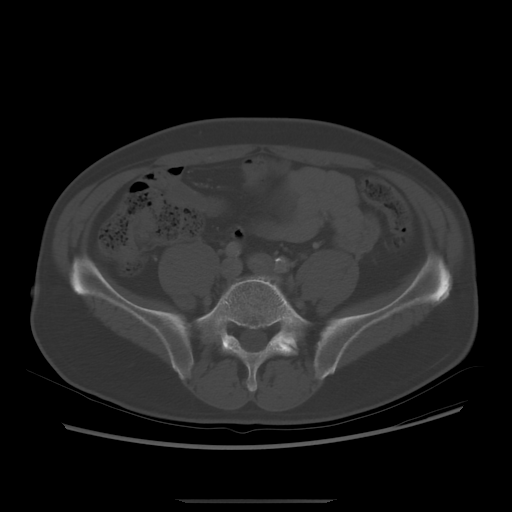
[im 19/66  soft-tissue]
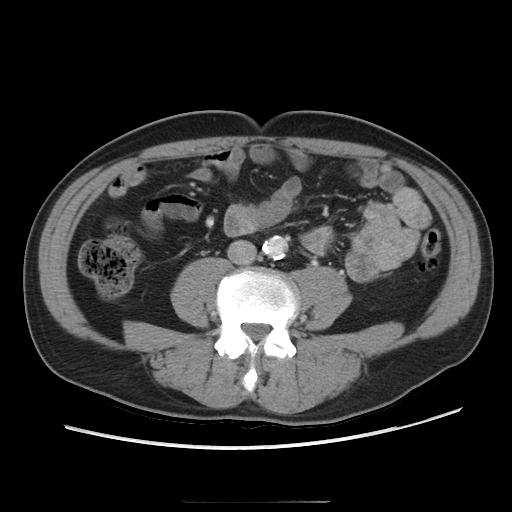
[im 28/66  soft-tissue]
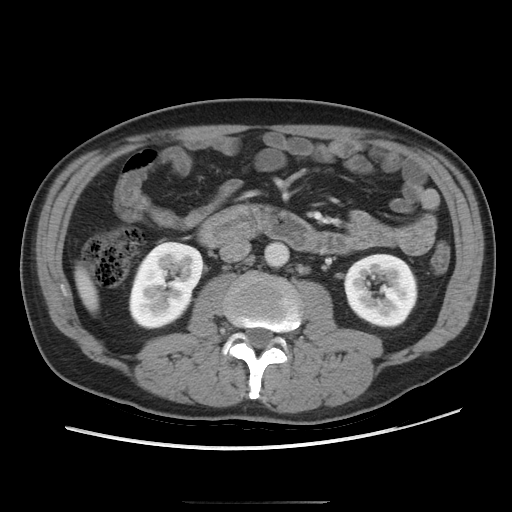
[im 28/66  lung]
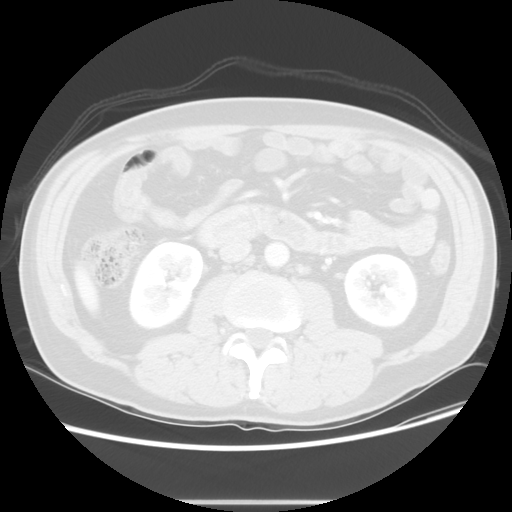
[im 38/66  soft-tissue]
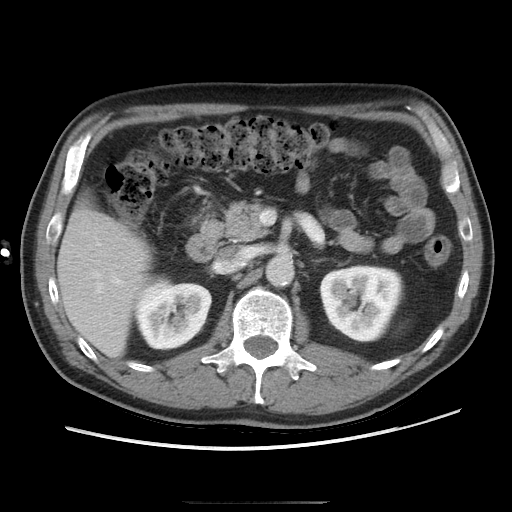
[im 38/66  lung]
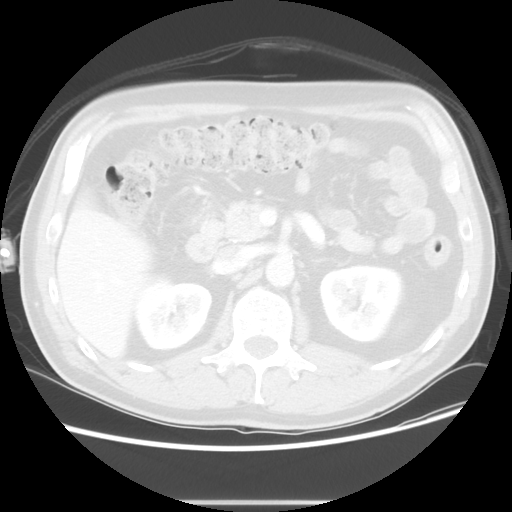
[im 47/66  soft-tissue]
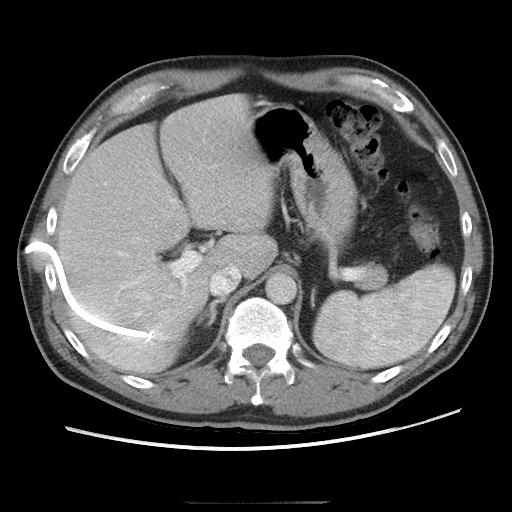
[im 47/66  lung]
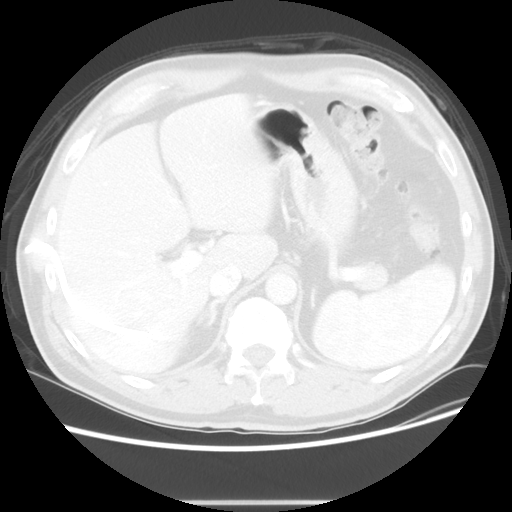
[im 56/66  soft-tissue]
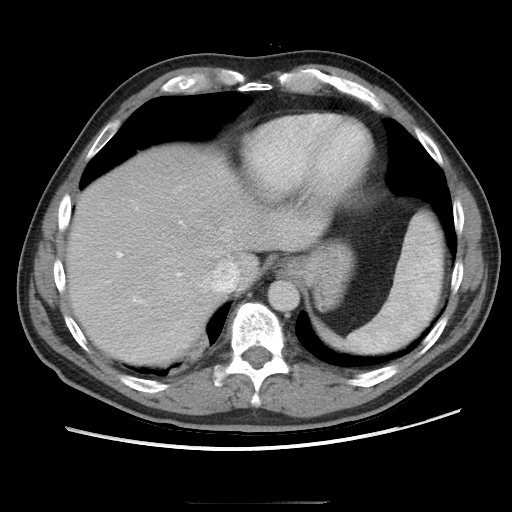
[im 56/66  lung]
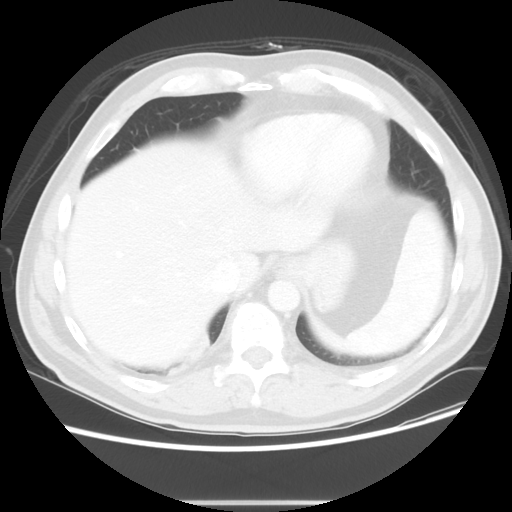

[Series 601: coronal body · coronal · 0.74mm/px · 1 of 116 slices shown, 2 images]
[im 39/116  soft-tissue]
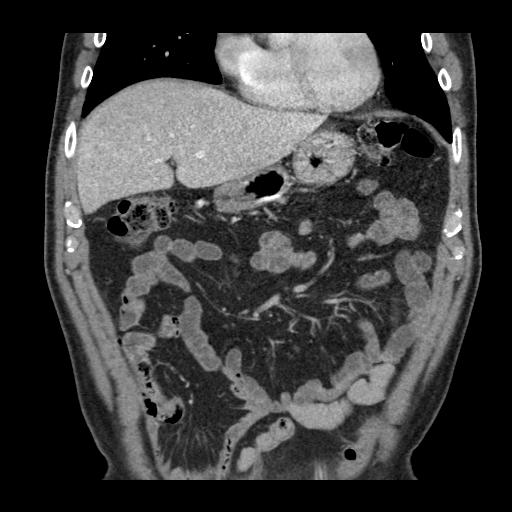
[im 39/116  bone]
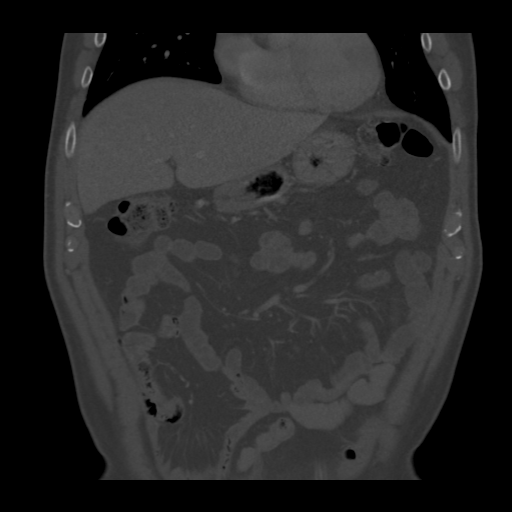

[Series 602: sagittal body · sagittal · 0.74mm/px · 5 of 151 slices shown]
[im 16/151  soft-tissue]
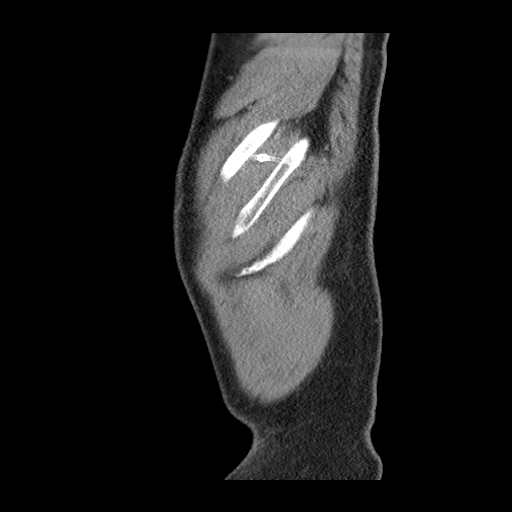
[im 32/151  soft-tissue]
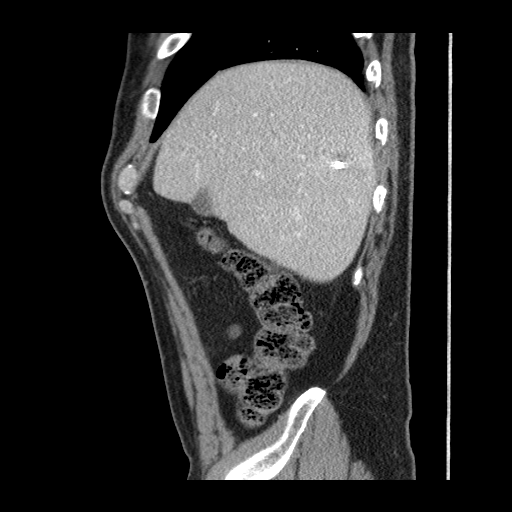
[im 48/151  soft-tissue]
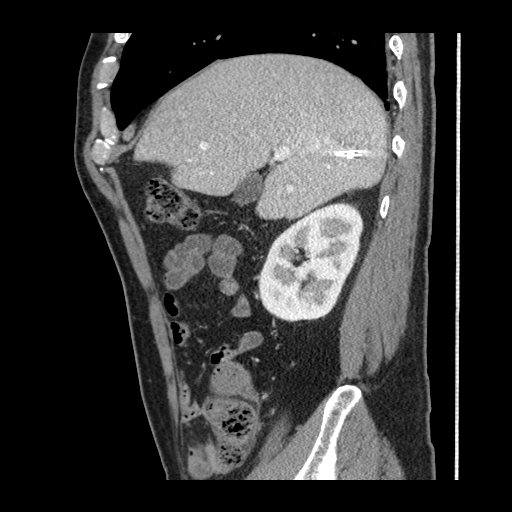
[im 64/151  soft-tissue]
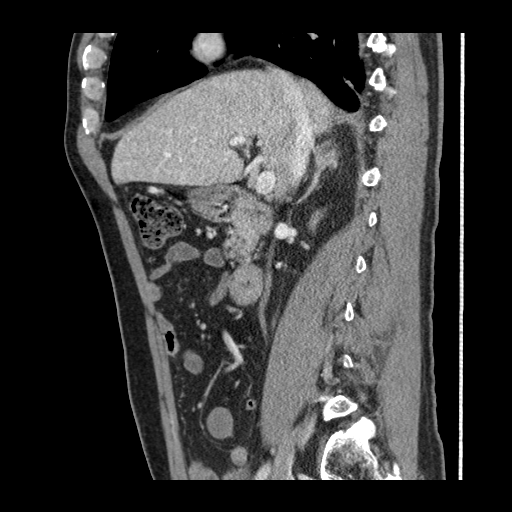
[im 87/151  soft-tissue]
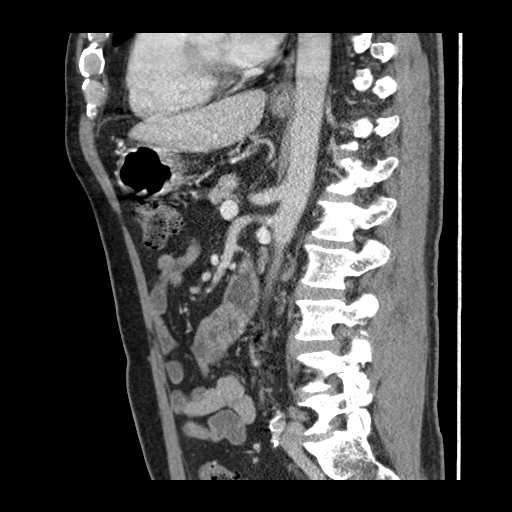

[12 of 36 positions shown; findings below may reference images not displayed]

FINDINGS: Lower chest: Mild atelectasis at the right lung base. No pleural
effusions.

Hepatobiliary: Stable position of the percutaneous drainage catheter
in the liver. Drainage catheter terminates in the posterior medial
aspect of the liver. There is no significant fluid surrounding this
drainage cavity. There is mild residual low density in this area
compatible with inflammation. No significant fluid collection at the
drain skin site. No new liver collections or abscesses. Normal
appearance of the gallbladder. Portal venous system is patent.

Pancreas: Normal appearance of the pancreas without inflammation or
duct dilatation.

Spleen: Normal appearance of spleen without enlargement.

Adrenals/Urinary Tract: Normal appearance of bilateral adrenal
glands. Normal appearance of both kidneys without suspicious lesions
or inflammation.

Stomach/Bowel: Normal appearance of the stomach, duodenum and small
bowel. No evidence for bowel dilatation or obstruction. There are
diverticula involving the visualized portions of the colon without
acute colonic inflammation.

Vascular/Lymphatic: Atherosclerotic calcifications in the aorta and
iliac arteries without aneurysm. Again noted are small periaortic
lymph nodes. No significant lymphadenopathy.

Other: No free fluid.

Musculoskeletal: No acute bone abnormality. There is a stable
deformity along the L2 superior endplate probably related to
endplate degenerative disease and a large Schmorl's node.
IMPRESSION: The hepatic abscess has resolved.  No new liver abscesses.

The percutaneous drain was removed following this procedure.
